# Patient Record
Sex: Male | Born: 1955 | Race: Black or African American | Hispanic: No | Marital: Single | Smoking: Current some day smoker
Health system: Southern US, Community
[De-identification: ages and names within clinical notes are randomized; demographics above are authoritative.]

---

## 2007-11-26 ENCOUNTER — Ambulatory Visit: Payer: Self-pay | Admitting: Internal Medicine

## 2007-11-26 LAB — CONVERTED CEMR LAB
Alkaline Phosphatase: 49 units/L (ref 39–117)
Eosinophils Absolute: 0.1 10*3/uL (ref 0.0–0.7)
Eosinophils Relative: 3 % (ref 0–5)
Glucose, Bld: 106 mg/dL — ABNORMAL HIGH (ref 70–99)
HCT: 38 % — ABNORMAL LOW (ref 39.0–52.0)
HCV Ab: NEGATIVE
HIV 1 RNA Quant: 52900 copies/mL — ABNORMAL HIGH (ref <50)
Hep B S Ab: NEGATIVE
Lymphs Abs: 1.3 10*3/uL (ref 0.7–4.0)
MCV: 85.8 fL (ref 78.0–100.0)
Platelets: 249 10*3/uL (ref 150–400)
RDW: 12.6 % (ref 11.5–15.5)
Sodium: 136 meq/L (ref 135–145)
Total Bilirubin: 0.4 mg/dL (ref 0.3–1.2)
Total Lymphocytes %: 34 % (ref 12–46)
Total Protein: 8 g/dL (ref 6.0–8.3)
WBC: 3.9 10*3/uL — ABNORMAL LOW (ref 4.0–10.5)

## 2007-11-30 ENCOUNTER — Ambulatory Visit: Payer: Self-pay | Admitting: *Deleted

## 2007-12-10 ENCOUNTER — Ambulatory Visit: Payer: Self-pay | Admitting: Internal Medicine

## 2007-12-28 ENCOUNTER — Ambulatory Visit (HOSPITAL_COMMUNITY): Admission: RE | Admit: 2007-12-28 | Discharge: 2007-12-28 | Payer: Self-pay | Admitting: Internal Medicine

## 2008-01-20 ENCOUNTER — Encounter: Payer: Self-pay | Admitting: Internal Medicine

## 2008-01-20 LAB — CONVERTED CEMR LAB
Albumin: 4 g/dL (ref 3.5–5.2)
Alkaline Phosphatase: 50 units/L (ref 39–117)
BUN: 13 mg/dL (ref 6–23)
CD4 T Helper %: 18 % — ABNORMAL LOW (ref 32–62)
Eosinophils Absolute: 0.2 10*3/uL (ref 0.0–0.7)
Eosinophils Relative: 6 % — ABNORMAL HIGH (ref 0–5)
Glucose, Bld: 81 mg/dL (ref 70–99)
HCT: 38.6 % — ABNORMAL LOW (ref 39.0–52.0)
HIV 1 RNA Quant: 761 copies/mL — ABNORMAL HIGH (ref <50)
HIV-1 RNA Quant, Log: 2.88 — ABNORMAL HIGH (ref <1.70)
Hemoglobin: 12.8 g/dL — ABNORMAL LOW (ref 13.0–17.0)
Lymphs Abs: 1.4 10*3/uL (ref 0.7–4.0)
MCV: 87.1 fL (ref 78.0–100.0)
Monocytes Absolute: 0.4 10*3/uL (ref 0.1–1.0)
Monocytes Relative: 10 % (ref 3–12)
RBC: 4.43 M/uL (ref 4.22–5.81)
Total Bilirubin: 0.3 mg/dL (ref 0.3–1.2)
Total lymphocyte count: 1400 cells/mcL (ref 700–3300)
WBC, lymph enumeration: 4 10*3/uL (ref 4.0–10.5)
WBC: 4 10*3/uL (ref 4.0–10.5)

## 2008-01-21 ENCOUNTER — Ambulatory Visit: Payer: Self-pay | Admitting: Internal Medicine

## 2008-02-04 ENCOUNTER — Ambulatory Visit: Payer: Self-pay | Admitting: Internal Medicine

## 2008-05-05 ENCOUNTER — Encounter: Payer: Self-pay | Admitting: Internal Medicine

## 2008-05-05 ENCOUNTER — Ambulatory Visit: Payer: Self-pay | Admitting: Internal Medicine

## 2008-05-05 LAB — CONVERTED CEMR LAB
ALT: 12 units/L (ref 0–53)
AST: 17 units/L (ref 0–37)
Absolute CD4: 345 #/uL — ABNORMAL LOW (ref 381–1469)
Alkaline Phosphatase: 67 units/L (ref 39–117)
BUN: 17 mg/dL (ref 6–23)
Basophils Absolute: 0 10*3/uL (ref 0.0–0.1)
Basophils Relative: 1 % (ref 0–1)
CD4 T Helper %: 34 % (ref 32–62)
Calcium: 8.6 mg/dL (ref 8.4–10.5)
Chloride: 105 meq/L (ref 96–112)
Creatinine, Ser: 1.11 mg/dL (ref 0.40–1.50)
Eosinophils Absolute: 0.1 10*3/uL (ref 0.0–0.7)
Hemoglobin: 12.5 g/dL — ABNORMAL LOW (ref 13.0–17.0)
MCHC: 33.3 g/dL (ref 30.0–36.0)
MCV: 87.6 fL (ref 78.0–100.0)
Monocytes Absolute: 0.4 10*3/uL (ref 0.1–1.0)
Neutro Abs: 2.3 10*3/uL (ref 1.7–7.7)
RDW: 13.9 % (ref 11.5–15.5)
Total Bilirubin: 0.3 mg/dL (ref 0.3–1.2)
Total Lymphocytes %: 26 % (ref 12–46)
Total lymphocyte count: 1014 cells/mcL (ref 700–3300)
WBC, lymph enumeration: 3.9 10*3/uL — ABNORMAL LOW (ref 4.0–10.5)

## 2008-05-14 IMAGING — CR DG HIP (WITH OR WITHOUT PELVIS) 2-3V*L*
3 series · 3 of 3 positions shown · non-contrast
Comparison: none

CLINICAL DATA: Pain. 
 LEFT HIP W/PELVIS ? 3 VIEW- 12/28/07:

[t pelvis a.p.]
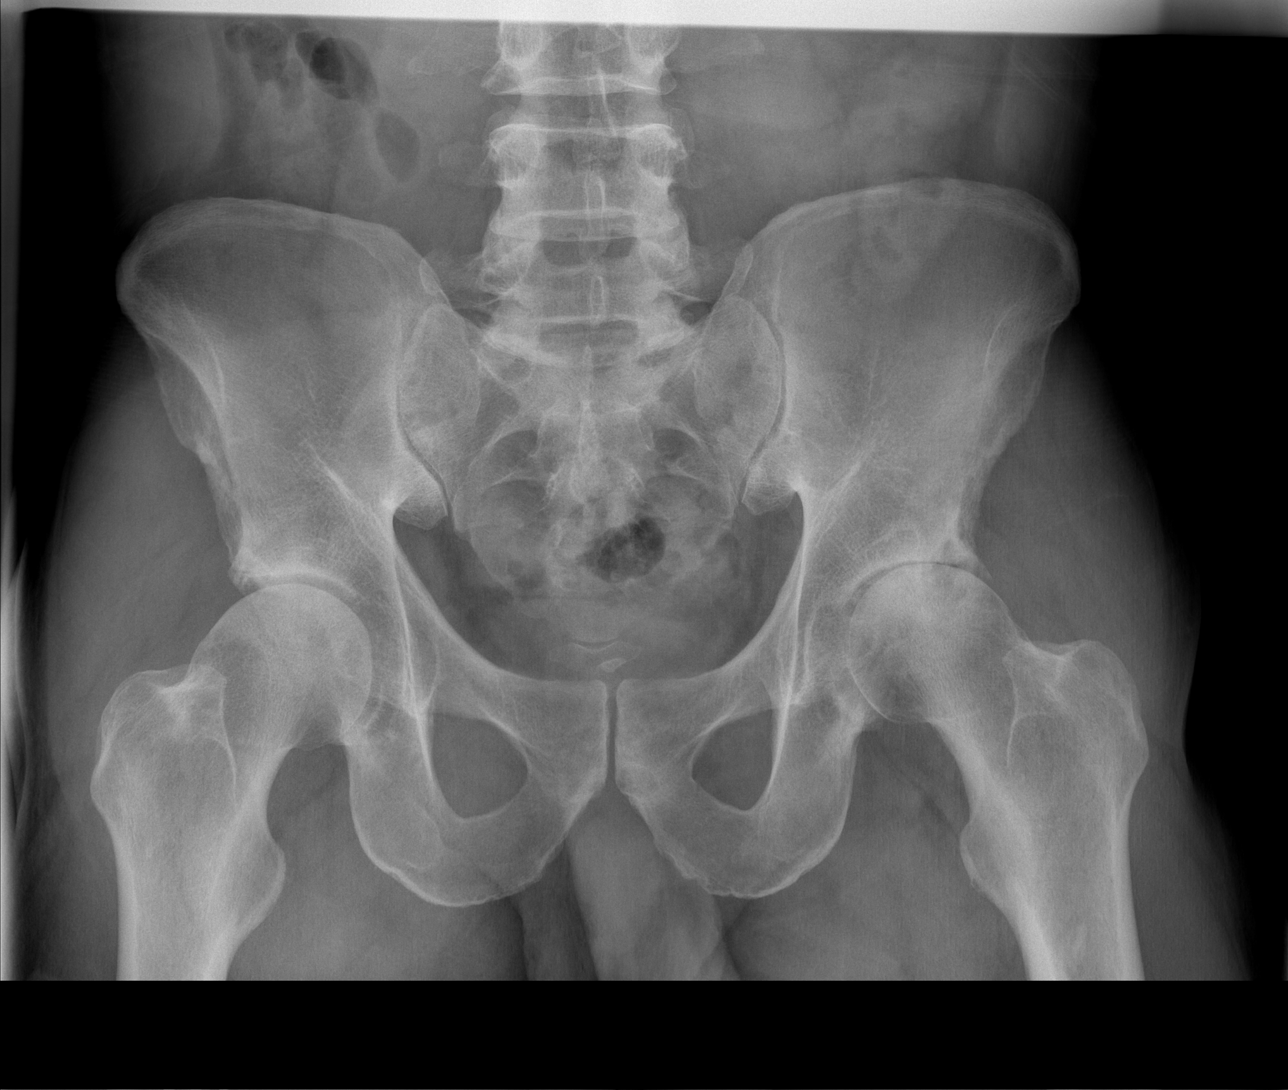

[t hip ap left]
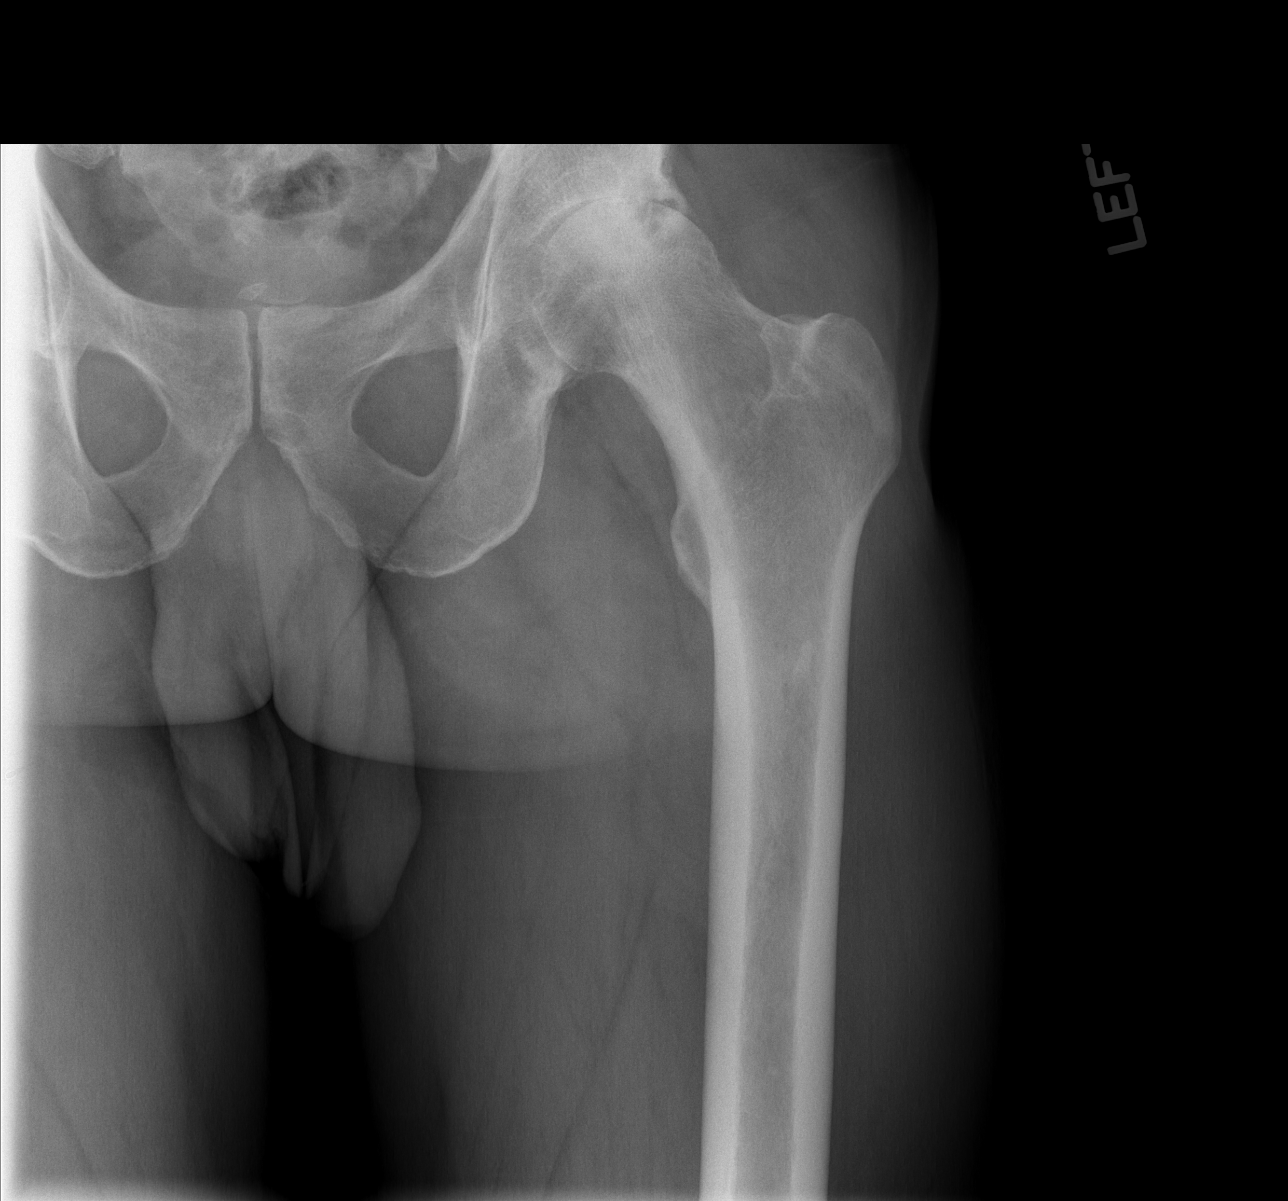

[t hip frog leg left]
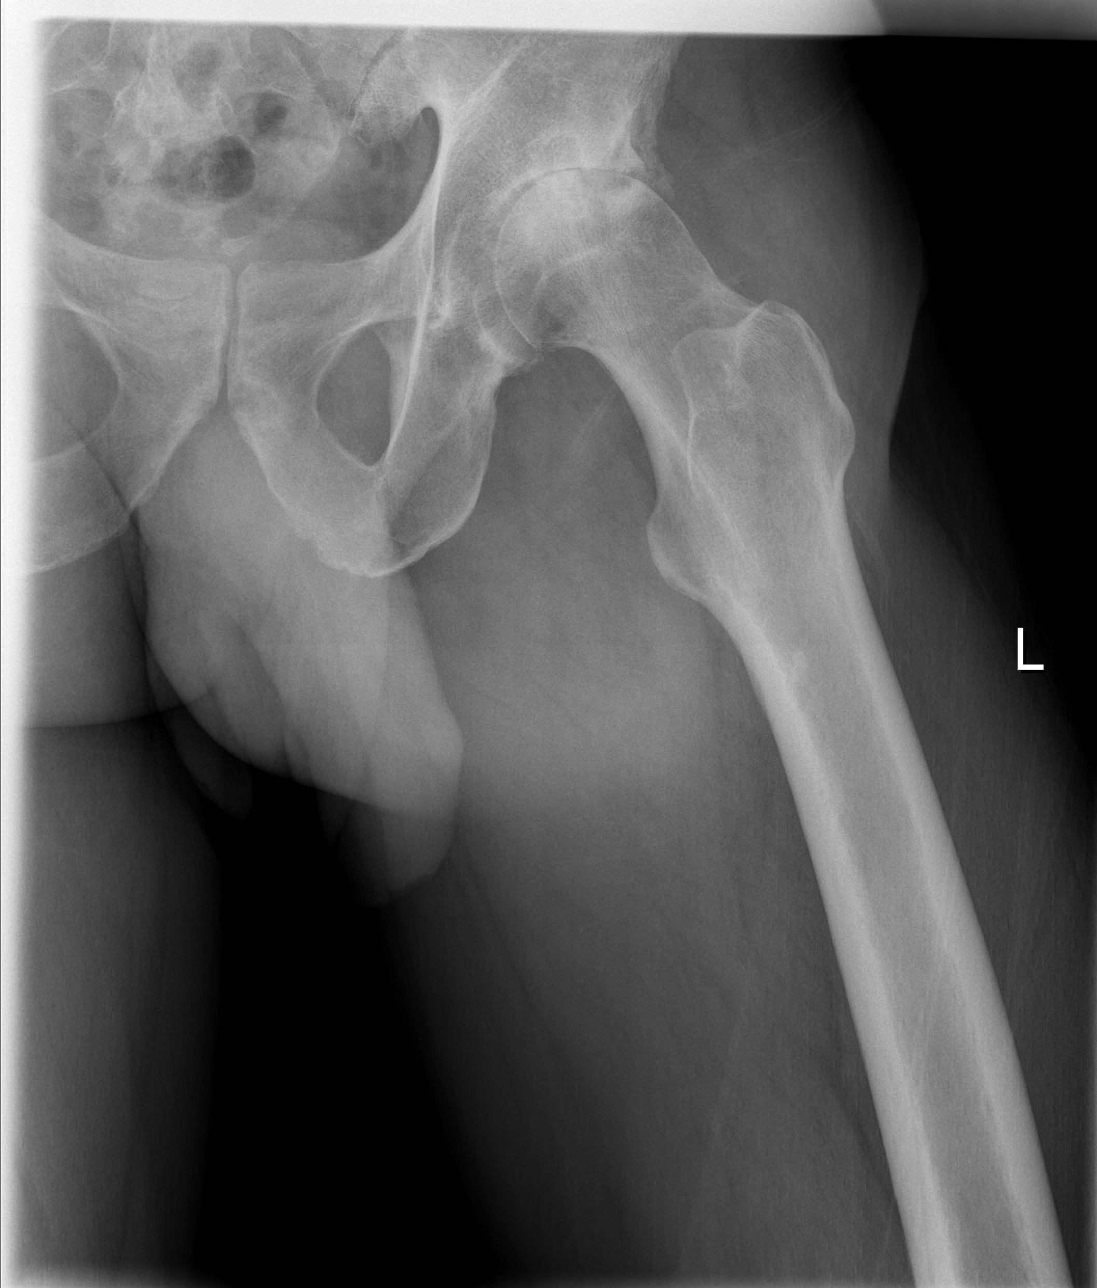

[3 of 3 positions shown; findings below may reference images not displayed]

FINDINGS: There is loss of the superior and lateral left hip joint space with bone on bone, juxtaarticular sclerosis, and juxtaarticular cystic change.  Similar but mild degenerative change of the right hip joint is present.  Mild degenerative changes in the SI joints.  No acute fractures or dislocations are seen.
IMPRESSION: Severe osteoarthritic change of the left hip joint.

## 2008-05-19 ENCOUNTER — Ambulatory Visit: Payer: Self-pay | Admitting: Internal Medicine

## 2008-05-20 ENCOUNTER — Ambulatory Visit (HOSPITAL_COMMUNITY): Admission: RE | Admit: 2008-05-20 | Discharge: 2008-05-20 | Payer: Self-pay | Admitting: Internal Medicine

## 2008-07-25 ENCOUNTER — Encounter: Payer: Self-pay | Admitting: Internal Medicine

## 2008-07-25 ENCOUNTER — Ambulatory Visit: Payer: Self-pay | Admitting: Internal Medicine

## 2008-07-25 LAB — CONVERTED CEMR LAB
ALT: 12 units/L (ref 0–53)
Albumin: 4.4 g/dL (ref 3.5–5.2)
Basophils Absolute: 0 10*3/uL (ref 0.0–0.1)
CD4 T Helper %: 36 % (ref 32–62)
CO2: 20 meq/L (ref 19–32)
Eosinophils Relative: 4 % (ref 0–5)
Glucose, Bld: 90 mg/dL (ref 70–99)
HIV 1 RNA Quant: 279 copies/mL — ABNORMAL HIGH (ref <50)
HIV-1 RNA Quant, Log: 2.45 — ABNORMAL HIGH (ref <1.70)
Lymphocytes Relative: 21 % (ref 12–46)
Lymphs Abs: 1.1 10*3/uL (ref 0.7–4.0)
Neutro Abs: 3.4 10*3/uL (ref 1.7–7.7)
Neutrophils Relative %: 65 % (ref 43–77)
Platelets: 255 10*3/uL (ref 150–400)
Potassium: 3.7 meq/L (ref 3.5–5.3)
RDW: 13.5 % (ref 11.5–15.5)
Sodium: 137 meq/L (ref 135–145)
Total Bilirubin: 0.6 mg/dL (ref 0.3–1.2)
Total Protein: 7.8 g/dL (ref 6.0–8.3)
Total lymphocyte count: 1071 cells/mcL (ref 700–3300)
WBC: 5.1 10*3/uL (ref 4.0–10.5)

## 2008-08-08 ENCOUNTER — Ambulatory Visit: Payer: Self-pay | Admitting: Internal Medicine

## 2008-12-05 DIAGNOSIS — I10 Essential (primary) hypertension: Secondary | ICD-10-CM | POA: Insufficient documentation

## 2008-12-05 DIAGNOSIS — B20 Human immunodeficiency virus [HIV] disease: Secondary | ICD-10-CM

## 2008-12-05 DIAGNOSIS — M25559 Pain in unspecified hip: Secondary | ICD-10-CM

## 2008-12-05 DIAGNOSIS — M1990 Unspecified osteoarthritis, unspecified site: Secondary | ICD-10-CM | POA: Insufficient documentation

## 2008-12-05 DIAGNOSIS — F339 Major depressive disorder, recurrent, unspecified: Secondary | ICD-10-CM

## 2009-01-16 ENCOUNTER — Ambulatory Visit: Payer: Self-pay | Admitting: Internal Medicine

## 2009-01-16 ENCOUNTER — Encounter: Payer: Self-pay | Admitting: Internal Medicine

## 2009-01-16 LAB — CONVERTED CEMR LAB
ALT: 25 units/L (ref 0–53)
Absolute CD4: 449 #/uL (ref 381–1469)
Albumin: 4 g/dL (ref 3.5–5.2)
CO2: 19 meq/L (ref 19–32)
Calcium: 8.8 mg/dL (ref 8.4–10.5)
Chloride: 105 meq/L (ref 96–112)
Glucose, Bld: 95 mg/dL (ref 70–99)
Hemoglobin: 12.3 g/dL — ABNORMAL LOW (ref 13.0–17.0)
Lymphs Abs: 1.8 10*3/uL (ref 0.7–4.0)
Monocytes Absolute: 0.4 10*3/uL (ref 0.1–1.0)
Monocytes Relative: 9 % (ref 3–12)
Neutro Abs: 2.2 10*3/uL (ref 1.7–7.7)
Neutrophils Relative %: 48 % (ref 43–77)
Potassium: 4 meq/L (ref 3.5–5.3)
RBC: 4.34 M/uL (ref 4.22–5.81)
Sodium: 137 meq/L (ref 135–145)
Total Lymphocytes %: 39 % (ref 12–46)
Total Protein: 7.7 g/dL (ref 6.0–8.3)
WBC: 4.6 10*3/uL (ref 4.0–10.5)

## 2009-01-30 ENCOUNTER — Ambulatory Visit: Payer: Self-pay | Admitting: Internal Medicine

## 2009-01-30 ENCOUNTER — Encounter: Payer: Self-pay | Admitting: Internal Medicine

## 2009-01-30 DIAGNOSIS — J45909 Unspecified asthma, uncomplicated: Secondary | ICD-10-CM | POA: Insufficient documentation

## 2009-03-13 ENCOUNTER — Ambulatory Visit: Payer: Self-pay | Admitting: Internal Medicine

## 2009-03-13 LAB — CONVERTED CEMR LAB
ALT: 13 units/L (ref 0–53)
AST: 15 units/L (ref 0–37)
Absolute CD4: 457 #/uL (ref 381–1469)
Albumin: 4.3 g/dL (ref 3.5–5.2)
BUN: 17 mg/dL (ref 6–23)
Calcium: 9.5 mg/dL (ref 8.4–10.5)
Chloride: 104 meq/L (ref 96–112)
Eosinophils Relative: 8 % — ABNORMAL HIGH (ref 0–5)
HCT: 41.5 % (ref 39.0–52.0)
Hemoglobin: 13.8 g/dL (ref 13.0–17.0)
Lymphocytes Relative: 34 % (ref 12–46)
Lymphs Abs: 1.6 10*3/uL (ref 0.7–4.0)
Monocytes Relative: 10 % (ref 3–12)
Platelets: 279 10*3/uL (ref 150–400)
Potassium: 4.4 meq/L (ref 3.5–5.3)
RBC: 4.88 M/uL (ref 4.22–5.81)
WBC: 4.8 10*3/uL (ref 4.0–10.5)

## 2009-03-27 ENCOUNTER — Encounter: Payer: Self-pay | Admitting: Internal Medicine

## 2009-03-27 ENCOUNTER — Ambulatory Visit: Payer: Self-pay | Admitting: Internal Medicine

## 2009-04-03 ENCOUNTER — Telehealth: Payer: Self-pay | Admitting: Internal Medicine

## 2009-06-29 ENCOUNTER — Ambulatory Visit: Payer: Self-pay | Admitting: Internal Medicine

## 2009-06-29 LAB — CONVERTED CEMR LAB
ALT: 12 U/L
AST: 13 U/L
Absolute CD4: 502 {#}/uL
Albumin: 4.2 g/dL
Alkaline Phosphatase: 65 U/L
BUN: 14 mg/dL
Basophils Absolute: 0 K/uL
Basophils Relative: 0 %
CD4 T Helper %: 36 %
CO2: 20 meq/L
Calcium: 9 mg/dL
Chloride: 108 meq/L
Creatinine, Ser: 1.06 mg/dL
Eosinophils Absolute: 0.2 K/uL
Eosinophils Relative: 5 %
Glucose, Bld: 89 mg/dL
HCT: 40.4 %
HIV 1 RNA Quant: 70 {copies}/mL — ABNORMAL HIGH
HIV-1 RNA Quant, Log: 1.85 — ABNORMAL HIGH
Hemoglobin: 13.3 g/dL
Lymphocytes Relative: 31 %
Lymphs Abs: 1.4 K/uL
MCHC: 32.9 g/dL
MCV: 89.4 fL
Monocytes Absolute: 0.4 K/uL
Monocytes Relative: 10 %
Neutro Abs: 2.4 K/uL
Neutrophils Relative %: 54 %
Platelets: 241 K/uL
Potassium: 4 meq/L
RBC: 4.52 M/uL
RDW: 13.4 %
Sodium: 138 meq/L
Total Bilirubin: 0.3 mg/dL
Total Protein: 7.7 g/dL
Total lymphocyte count: 1395 {cells}/uL
WBC: 4.5 10*3/microliter

## 2009-07-10 ENCOUNTER — Encounter: Payer: Self-pay | Admitting: Internal Medicine

## 2009-07-10 ENCOUNTER — Ambulatory Visit: Payer: Self-pay | Admitting: Internal Medicine

## 2009-08-03 ENCOUNTER — Telehealth: Payer: Self-pay | Admitting: Internal Medicine

## 2009-10-02 ENCOUNTER — Encounter: Payer: Self-pay | Admitting: Internal Medicine

## 2009-10-02 ENCOUNTER — Ambulatory Visit: Payer: Self-pay | Admitting: Internal Medicine

## 2009-10-02 LAB — CONVERTED CEMR LAB
AST: 14 units/L (ref 0–37)
Albumin: 4.6 g/dL (ref 3.5–5.2)
Alkaline Phosphatase: 50 units/L (ref 39–117)
Basophils Absolute: 0 10*3/uL (ref 0.0–0.1)
CD4 T Helper %: 35 % (ref 32–62)
Calcium: 9.2 mg/dL (ref 8.4–10.5)
Chloride: 108 meq/L (ref 96–112)
HCT: 44.8 % (ref 39.0–52.0)
HIV 1 RNA Quant: 128 copies/mL — ABNORMAL HIGH (ref <48)
HIV-1 RNA Quant, Log: 2.11 — ABNORMAL HIGH (ref <1.68)
Hemoglobin: 14.6 g/dL (ref 13.0–17.0)
LDL Cholesterol: 156 mg/dL — ABNORMAL HIGH (ref 0–99)
Lymphocytes Relative: 23 % (ref 12–46)
Lymphs Abs: 0.9 10*3/uL (ref 0.7–4.0)
Monocytes Absolute: 0.4 10*3/uL (ref 0.1–1.0)
Neutro Abs: 2.5 10*3/uL (ref 1.7–7.7)
Platelets: 264 10*3/uL (ref 150–400)
Potassium: 4.6 meq/L (ref 3.5–5.3)
RDW: 13.6 % (ref 11.5–15.5)
Sodium: 138 meq/L (ref 135–145)
Total Protein: 7.9 g/dL (ref 6.0–8.3)
Total lymphocyte count: 897 cells/mcL (ref 700–3300)
WBC: 3.9 10*3/uL — ABNORMAL LOW (ref 4.0–10.5)

## 2009-11-30 ENCOUNTER — Encounter: Payer: Self-pay | Admitting: Internal Medicine

## 2009-11-30 ENCOUNTER — Ambulatory Visit: Payer: Self-pay | Admitting: Internal Medicine

## 2009-11-30 DIAGNOSIS — E785 Hyperlipidemia, unspecified: Secondary | ICD-10-CM | POA: Insufficient documentation

## 2010-02-08 ENCOUNTER — Encounter: Payer: Self-pay | Admitting: Internal Medicine

## 2010-02-20 ENCOUNTER — Telehealth: Payer: Self-pay | Admitting: Internal Medicine

## 2010-09-05 ENCOUNTER — Telehealth: Payer: Self-pay | Admitting: Internal Medicine

## 2010-09-05 ENCOUNTER — Ambulatory Visit: Payer: Self-pay | Admitting: Internal Medicine

## 2010-09-05 LAB — CONVERTED CEMR LAB
AST: 47 units/L — ABNORMAL HIGH (ref 0–37)
Alkaline Phosphatase: 71 units/L (ref 39–117)
BUN: 19 mg/dL (ref 6–23)
Basophils Absolute: 0 10*3/uL (ref 0.0–0.1)
Calcium: 9.2 mg/dL (ref 8.4–10.5)
Chloride: 107 meq/L (ref 96–112)
Creatinine, Ser: 1.2 mg/dL (ref 0.40–1.50)
Eosinophils Relative: 4 % (ref 0–5)
HCT: 40.2 % (ref 39.0–52.0)
Hemoglobin: 13.4 g/dL (ref 13.0–17.0)
Lymphocytes Relative: 33 % (ref 12–46)
MCV: 88.4 fL (ref 78.0–100.0)
Monocytes Absolute: 0.5 10*3/uL (ref 0.1–1.0)
RDW: 13.3 % (ref 11.5–15.5)
Total Bilirubin: 0.3 mg/dL (ref 0.3–1.2)

## 2010-09-12 ENCOUNTER — Encounter: Payer: Self-pay | Admitting: Internal Medicine

## 2010-09-20 ENCOUNTER — Ambulatory Visit: Payer: Self-pay | Admitting: Internal Medicine

## 2010-09-27 ENCOUNTER — Ambulatory Visit: Payer: Self-pay | Admitting: Internal Medicine

## 2010-09-27 ENCOUNTER — Encounter (INDEPENDENT_AMBULATORY_CARE_PROVIDER_SITE_OTHER): Payer: Self-pay | Admitting: *Deleted

## 2010-09-28 ENCOUNTER — Encounter (INDEPENDENT_AMBULATORY_CARE_PROVIDER_SITE_OTHER): Payer: Self-pay | Admitting: *Deleted

## 2010-10-26 ENCOUNTER — Encounter: Payer: Self-pay | Admitting: Internal Medicine

## 2010-11-08 ENCOUNTER — Ambulatory Visit: Admit: 2010-11-08 | Payer: Self-pay | Admitting: Internal Medicine

## 2010-11-20 NOTE — Progress Notes (Signed)
Summary: refill meds  Phone Note Call from Patient   Caller: Patient Summary of Call: pt. came in for lab work and is requesting a refill on Ibuprofen and B/P med.  Pt. takes Norvasc but it is not on the $4.00 list at [facility] and pt. use to be able to get meds at [facility].  Can you switch to a $4.00 B/P med.  Uses [facility] [address] [place] Initial call taken by: Wendall Mola CMA Duncan Dull),  September 05, 2010 3:10 PM  Follow-up for Phone Call        change to diltiazem 120mg  q d Follow-up by: Yisroel Ramming MD,  September 05, 2010 3:36 PM

## 2010-11-20 NOTE — Miscellaneous (Signed)
Summary: HIPAA Restrictions  HIPAA Restrictions   Imported By: Florinda Marker 09/05/2010 16:13:29  _____________________________________________________________________  External Attachment:    Type:   Image     Comment:   External Document

## 2010-11-20 NOTE — Miscellaneous (Signed)
Summary: RW Financial Update   Clinical Lists Changes  Observations: Added new observation of AIDSDAP: No-applying (09/27/2010 17:05) Added new observation of PCTFPL: 0  (09/27/2010 17:05) Added new observation of INCOMESOURCE: none  (09/27/2010 17:05) Added new observation of HOUSEINCOME: 0  (09/27/2010 17:05) Added new observation of #CHILD<18 IN: No  (09/27/2010 17:05) Added new observation of FAMILYSIZE: 1  (09/27/2010 17:05) Added new observation of HOUSING: Stable/permanent  (09/27/2010 17:05) Added new observation of FINASSESSDT: 09/27/2010  (09/27/2010 17:05) Added new observation of YEARLYEXPEN: 0  (09/27/2010 17:05)  Appended Document: RW Financial Update  Received call from ADAP - Patient has Medicaid [place] - ID # [id number] T - does not need ADAP

## 2010-11-20 NOTE — Miscellaneous (Signed)
  Clinical Lists Changes  Orders: Added new Test order of T-HIV Genotype [phone]) - Signed     Process Orders Check Orders Results:     [facility]: ABN not required for this insurance Tests Sent for requisitioning (September 12, 2010 10:53 AM):     09/12/2010: [facility] -- T-HIV Genotype [phone] (signed)

## 2010-11-20 NOTE — Assessment & Plan Note (Signed)
Summary: start new regimen/jc   CC:  f/u change in meds.  History of Present Illness: patient here to get the results of his genotype.  His genotype showed that he had resistance to do a triple that which he is currently taking.  We discussed treatment options.  He wanted a once a day regimen.  He has yet to pick up his new blood pressure medication.  He states he will do that today as he has money.   Current Allergies (reviewed today): No known allergies  Review of Systems  The patient denies anorexia, fever, and weight loss.    Vital Signs:  Patient profile:   55 year old male Height:      71 inches (180.34 cm) Weight:      189 pounds (85.91 kg) BMI:     26.46 Temp:     97.5 degrees F (36.39 degrees C) oral Pulse rate:   91 / minute BP sitting:   170 / 98  (left arm)  Vitals Entered By: Starleen Arms CMA (September 27, 2010 2:56 PM) CC: f/u change in meds Is Patient Diabetic? No Pain Assessment Patient in pain? yes     Location: hip Intensity: 5 Type: aching Nutritional Status BMI of 25 - 29 = overweight  Does patient need assistance? Functional Status Self care Ambulation Normal   Physical Exam  General:  alert, well-developed, well-nourished, and well-hydrated.   Head:  normocephalic and atraumatic.   Mouth:  pharynx pink and moist.   Lungs:  normal breath sounds.      Impression & Recommendations:  Problem # 1:  HIV DISEASE (ICD-042) Genotype shows resistance toAtripla will switch to Truvada, Prezista and Norvir.  Potential side effects discussed.  Repeat labsin 6 weeks. Diagnostics Reviewed:  CD4: 350 (09/06/2010)   WBC: 3.9 (09/05/2010)   Hgb: 13.4 (09/05/2010)   HCT: 40.2 (09/05/2010)   Platelets: 237 (09/05/2010) HIV genotype: See Comment (09/12/2010)   HIV-1 RNA: 51600 (09/05/2010)   HBSAg: NEG (11/26/2007)  Problem # 2:  HYPERTENSION (ICD-401.9) Pt encouraged to fill his Rx and take his BP medication. His updated medication list for this  problem includes:    Diltiazem Hcl 120 Mg Tabs (Diltiazem hcl) .Marland Kitchen... Take 1 tablet by mouth once a day  Medications Added to Medication List This Visit: 1)  Truvada 200-300 Mg Tabs (Emtricitabine-tenofovir) .... Take 1 tablet by mouth once a day 2)  Prezista 400 Mg Tabs (Darunavir ethanolate) .... Take 2 tablets once a day 3)  Norvir 100 Mg Tabs (Ritonavir) .... Take 1 tablet by mouth once a day  Other Orders: Est. Patient Level III (04540) Future Orders: T-CD4SP ([facility]) (CD4SP) ... 11/08/2010 T-HIV Viral Load [phone]) ... 11/08/2010 T-Comprehensive Metabolic Panel [phone]) ... 11/08/2010 T-CBC w/Diff ([id number]) ... 11/08/2010 T-RPR (Syphilis) [phone]) ... 11/08/2010  Patient Instructions: 1)  Please schedule a follow-up appointment in 8 weeks, 2 weeks after labs.  Prescriptions: NORVIR 100 MG TABS (RITONAVIR) Take 1 tablet by mouth once a day  #30 x 5   Entered and Authorized by:   Yisroel Ramming MD   Signed by:   Yisroel Ramming MD on 09/27/2010   Method used:   Print then Give to Patient   RxID:   [id number] PREZISTA 400 MG TABS (DARUNAVIR ETHANOLATE) take 2 tablets once a day  #60 x 5   Entered and Authorized by:   Yisroel Ramming MD   Signed by:   Yisroel Ramming MD on 09/27/2010   Method used:  Print then Give to Patient   RxID:   [id number] TRUVADA 200-300 MG TABS (EMTRICITABINE-TENOFOVIR) Take 1 tablet by mouth once a day  #30 x 5   Entered and Authorized by:   Yisroel Ramming MD   Signed by:   Yisroel Ramming MD on 09/27/2010   Method used:   Print then Give to Patient   RxID:   [id number]

## 2010-11-20 NOTE — Consult Note (Signed)
Summary: [facility] [place]: Comprehensive Clinical  Psychological  [facility] [place]: Comprehensive Clinical  Psychological   Imported By: Florinda Marker 05/02/2010 10:10:06  _____________________________________________________________________  External Attachment:    Type:   Image     Comment:   External Document

## 2010-11-20 NOTE — Assessment & Plan Note (Signed)
Summary: CHECKUP [MKJ]   CC:  follow-up visit, lab results, refill Lipitor, and stopped Atripla 2 months ago due to nightmares.  History of Present Illness: patient here for follow-up on labs.  He is not taking to a trip for several months now.  He states that his mother died approximately 1 year ago and he became depressed and started drinking and stopped taking his medication.  He wants to get back on his medication.  He did not pick up the diltiazem that we called in to [facility].  He states he will pick that up today.  He denies chest pain or headaches.  Preventive Screening-Counseling & Management  Alcohol-Tobacco     Alcohol drinks/day: occasional     Smoking Status: quit  Caffeine-Diet-Exercise     Caffeine use/day: occasional     Does Patient Exercise: yes     Type of exercise: walks     Exercise (avg: min/session): >60     Times/week: 5  Safety-Violence-Falls     Seat Belt Use: yes      Drug Use:  never.    Comments: pt. given condoms   Updated Prior Medication List: ULTRAM 50 MG TABS (TRAMADOL HCL) Take 1 tablet by mouth every 8 hours as needed ATRIPLA 600-200-300 MG TABS (EFAVIRENZ-EMTRICITAB-TENOFOVIR) Take 1 tablet by mouth at bedtime PROAIR HFA 108 (90 BASE) MCG/ACT AERS (ALBUTEROL SULFATE) one puff every 6 hours as needed ADVAIR DISKUS 250-50 MCG/DOSE MISC (FLUTICASONE-SALMETEROL) one puff two times a day IBUPROFEN 800 MG TABS (IBUPROFEN) Take 1 tablet by mouth every 8 hours as needed LIPITOR 20 MG TABS (ATORVASTATIN CALCIUM) Take 1 tablet by mouth once a day DILTIAZEM HCL 120 MG TABS (DILTIAZEM HCL) Take 1 tablet by mouth once a day  Current Allergies (reviewed today): No known allergies  Past History:  Past Medical History: Last updated: 01/30/2009 HIV disease Hypertension Osteoarthritis Depression Asthma  Social History: Drug Use:  never  Review of Systems  The patient denies anorexia, fever, and weight loss.    Vital Signs:  Patient  profile:   55 year old male Height:      71 inches (180.34 cm) Weight:      187.0 pounds (85 kg) BMI:     26.18 Temp:     98.0 degrees F (36.67 degrees C) oral Pulse rate:   90 / minute BP sitting:   177 / 96  (right arm)  Vitals Entered By: Wendall Mola CMA Duncan Dull) (September 20, 2010 3:35 PM) CC: follow-up visit, lab results, refill Lipitor, stopped Atripla 2 months ago due to nightmares Is Patient Diabetic? No Pain Assessment Patient in pain? yes     Location: left hip Intensity: 5 Type: aching Onset of pain  Constant Nutritional Status BMI of 25 - 29 = overweight Nutritional Status Detail appetite "normal"  Have you ever been in a relationship where you felt threatened, hurt or afraid?No   Does patient need assistance? Functional Status Self care Ambulation Normal   Physical Exam  General:  alert, well-developed, well-nourished, and well-hydrated.   Head:  normocephalic and atraumatic.   Mouth:  pharynx pink and moist.   Lungs:  normal breath sounds.     Impression & Recommendations:  Problem # 1:  HIV DISEASE (ICD-042) Pt will re-start meds and try to take them every day. He will f/u in 6 weeks for repeat labs. Diagnostics Reviewed:  CD4: 350 (09/06/2010)   WBC: 3.9 (09/05/2010)   Hgb: 13.4 (09/05/2010)   HCT: 40.2 (09/05/2010)  Platelets: 237 (09/05/2010) HIV genotype: REPORT (11/26/2007)   HIV-1 RNA: 51600 (09/05/2010)   HBSAg: NEG (11/26/2007)  Problem # 2:  HYPERTENSION (ICD-401.9) Pt encouraged to pick up his diltiazem and take it every day. His updated medication list for this problem includes:    Diltiazem Hcl 120 Mg Tabs (Diltiazem hcl) .Marland Kitchen... Take 1 tablet by mouth once a day  Other Orders: Influenza Vaccine NON MCR (41324) TB Skin Test (40102) Admin 1st Vaccine (72536) Est. Patient Level III (64403) Future Orders: T-CD4SP ([facility]) (CD4SP) ... 11/01/2010 T-HIV Viral Load [phone]) ... 11/01/2010 T-Comprehensive Metabolic Panel  ([id number]) ... 11/01/2010 T-CBC w/Diff ([id number]) ... 11/01/2010 T-RPR (Syphilis) [phone]) ... 11/01/2010  Patient Instructions: 1)  Please schedule a follow-up appointment in 3 months, 2 weeks after labs.      Immunizations Administered:  Influenza Vaccine # 1:    Vaccine Type: Fluvax Non-MCR    Site: right deltoid    Mfr: Novartis    Dose: 0.5 ml    Route: IM    Given by: Wendall Mola CMA ( AAMA)    Exp. Date: 01/20/2011    Lot #: [id number]    VIS given: 05/15/10 version given September 20, 2010.  PPD Skin Test:    Vaccine Type: PPD    Site: left forearm    Mfr: Sanofi Pasteur    Dose: 0.1 ml    Route: ID    Given by: Wendall Mola CMA ( AAMA)    Exp. Date: 08/23/2011    Lot #: [id number]  Flu Vaccine Consent Questions:    Do you have a history of severe allergic reactions to this vaccine? no    Any prior history of allergic reactions to egg and/or gelatin? no    Do you have a sensitivity to the preservative Thimersol? no    Do you have a past history of Guillan-Barre Syndrome? no    Do you currently have an acute febrile illness? no    Have you ever had a severe reaction to latex? no    Vaccine information given and explained to patient? yes

## 2010-11-20 NOTE — Miscellaneous (Signed)
  Clinical Lists Changes  Orders: Added new Test order of T-CBC w/Diff [id number]) - Signed Added new Test order of T-CD4SP [facility]) (CD4SP) - Signed Added new Test order of T-Comprehensive Metabolic Panel [phone]) - Signed Added new Test order of T-HIV Viral Load [id number]) - Signed     Process Orders Check Orders Results:     [facility]: ABN not required for this insurance Tests Sent for requisitioning (September 05, 2010 3:18 PM):     09/05/2010: [facility] -- T-CBC w/Diff [[id number]] (signed)     09/05/2010: [facility] -- T-Comprehensive Metabolic Panel [[id number]] (signed)     09/05/2010: [facility] -- T-HIV Viral Load [id number] (signed)

## 2010-11-20 NOTE — Miscellaneous (Signed)
Summary: Office Visit ([facility] 05)    Vital Signs:  Patient profile:   55 year old male Weight:      190 pounds Temp:     97.9 degrees F oral Pulse rate:   74 / minute Resp:     18 per minute BP sitting:   170 / 113  (left arm)  Vitals Entered By: Sharen Heck RN (November 30, 2009 9:42 AM) CC: f/u 05,not takingBP meds  Is Patient Diabetic? No Pain Assessment Patient in pain? yes     Location: left hip Intensity: 7 Type: aching Onset of pain  Chronic  Does patient need assistance? Functional Status Self care Ambulation Normal Comments BPBP   CC:  f/u 05 and not takingBP meds .  History of Present Illness: Pt has been out of his BP medications.  He is having trouble getting to our pharmacy. He did have a HA this morning.  He continues to have left hip pain. No missed doses of his Atripla.  Preventive Screening-Counseling & Management  Alcohol-Tobacco     Alcohol drinks/day: 0     Smoking Status: quit  Caffeine-Diet-Exercise     Caffeine use/day: 0     Does Patient Exercise: yes     Type of exercise: walks     Exercise (avg: min/session): >60     Times/week: 5  Current Problems (verified): 1)  Asthma  (ICD-493.90) 2)  Hip Pain, Left, Chronic  (ICD-719.45) 3)  Depression  (ICD-311) 4)  Osteoarthritis  (ICD-715.90) 5)  Hypertension  (ICD-401.9) 6)  HIV Disease  (ICD-042)  Current Medications (verified): 1)  Ultram 50 Mg Tabs (Tramadol Hcl) .... Take 1 Tablet By Mouth Every 8 Hours As Needed 2)  Atripla 600-200-300 Mg Tabs (Efavirenz-Emtricitab-Tenofovir) .... Take 1 Tablet By Mouth At Bedtime 3)  Proair Hfa 108 (90 Base) Mcg/act Aers (Albuterol Sulfate) .... One Puff Every 6 Hours As Needed 4)  Advair Diskus 250-50 Mcg/dose Misc (Fluticasone-Salmeterol) .... One Puff Two Times A Day 5)  Ibuprofen 800 Mg Tabs (Ibuprofen) .... Take 1 Tablet By Mouth Every 8 Hours As Needed 6)  Norvasc 10 Mg Tabs (Amlodipine Besylate) .... Take 1 Tablet By Mouth Once A  Day 7)  Lipitor 20 Mg Tabs (Atorvastatin Calcium) .... Take 1 Tablet By Mouth Once A Day  Allergies (verified): No Known Drug Allergies   Review of Systems  The patient denies anorexia, fever, weight loss, and chest pain.     Physical Exam  General:  alert, well-developed, well-nourished, and well-hydrated.   Head:  normocephalic and atraumatic.   Mouth:  pharynx pink and moist.   Lungs:  normal breath sounds.      Impression & Recommendations:  Problem # 1:  HIV DISEASE (ICD-042) Will obtain labs in 4 weeks.  Pt to continue his Atripla and try not to miss any doses. Diagnostics Reviewed:  WBC: 3.9 (10/02/2009)   Hgb: 14.6 (10/02/2009)   HCT: 44.8 (10/02/2009)   Platelets: 264 (10/02/2009) HIV genotype: REPORT (11/26/2007)   HIV-1 RNA: 128 (10/02/2009)   HBSAg: NEG (11/26/2007)  Future Orders: T-CBC w/Diff ([id number]) ... 01/01/2010 T-CD4SP ([facility]) (CD4SP) ... 01/01/2010 T-Comprehensive Metabolic Panel [id number]) ... 01/01/2010 T-HIV Viral Load [id number]) ... 01/01/2010 T-Lipid Profile [id number]) ... 01/01/2010  Problem # 2:  HYPERTENSION (ICD-401.9) Assessment: Deteriorated Pt instructed to get back on norvasc. The following medications were removed from the medication list:    Vasotec 20 Mg Tabs (Enalapril maleate) .Marland Kitchen... Take 1 tablet by mouth once a day His  updated medication list for this problem includes:    Norvasc 10 Mg Tabs (Amlodipine besylate) .Marland Kitchen... Take 1 tablet by mouth once a day  Problem # 3:  HYPERLIPIDEMIA (ICD-272.4) discussed diet and exercise will start lipitor His updated medication list for this problem includes:    Lipitor 20 Mg Tabs (Atorvastatin calcium) .Marland Kitchen... Take 1 tablet by mouth once a day  Medications Added to Medication List This Visit: 1)  Lipitor 20 Mg Tabs (Atorvastatin calcium) .... Take 1 tablet by mouth once a day  Other Orders: Est. Patient Level III (16109)    Patient Instructions: 1)  Please schedule a  follow-up appointment in 6 weeks, 2 weeks after labs. Prescriptions: LIPITOR 20 MG TABS (ATORVASTATIN CALCIUM) Take 1 tablet by mouth once a day  #30 x 5   Entered and Authorized by:   Yisroel Ramming MD   Signed by:   Yisroel Ramming MD on 11/30/2009   Method used:   Print then Give to Patient   RxID:   [id number] NORVASC 10 MG TABS (AMLODIPINE BESYLATE) Take 1 tablet by mouth once a day  #30 x 5   Entered and Authorized by:   Yisroel Ramming MD   Signed by:   Yisroel Ramming MD on 11/30/2009   Method used:   Print then Give to Patient   RxID:   [id number]

## 2010-11-20 NOTE — Progress Notes (Signed)
Summary: Request to restart medication  Phone Note From Pharmacy   Caller: [facility] Reason for Call: Needs renewal Summary of Call: Received a fax refill request for Lisinopril #30 Once daily.  Last filled on 3/292010.  This drug is not in the EMR.  Do you want to restart this medication? it was removed in February 2011 from his medication list. Initial call taken by: Paulo Fruit  BS,CPht II,MPH,  Feb 20, 2010 3:39 PM  Follow-up for Phone Call        no changed to norvas Follow-up by: Yisroel Ramming MD,  Feb 20, 2010 4:15 PM  Additional Follow-up for Phone Call Additional follow up Details #1::        [facility] notified that Lisinopril should have been discontinued.  He is to be taking Norvasc 10mg  once daily. Additional Follow-up by: Paulo Fruit  BS,CPht II,MPH,  Feb 20, 2010 4:28 PM

## 2010-11-21 ENCOUNTER — Encounter: Payer: Self-pay | Admitting: Adult Health

## 2010-11-21 ENCOUNTER — Other Ambulatory Visit (INDEPENDENT_AMBULATORY_CARE_PROVIDER_SITE_OTHER): Payer: Medicaid Other

## 2010-11-21 ENCOUNTER — Other Ambulatory Visit: Payer: Self-pay | Admitting: Internal Medicine

## 2010-11-21 DIAGNOSIS — B20 Human immunodeficiency virus [HIV] disease: Secondary | ICD-10-CM

## 2010-11-21 LAB — CONVERTED CEMR LAB
ALT: 18 units/L (ref 0–53)
AST: 23 units/L (ref 0–37)
Albumin: 4.3 g/dL (ref 3.5–5.2)
BUN: 23 mg/dL (ref 6–23)
Basophils Absolute: 0 10*3/uL (ref 0.0–0.1)
Calcium: 9.1 mg/dL (ref 8.4–10.5)
Chloride: 105 meq/L (ref 96–112)
HIV-1 RNA Quant, Log: 1.72 — ABNORMAL HIGH (ref <1.30)
Lymphocytes Relative: 24 % (ref 12–46)
Neutro Abs: 2.7 10*3/uL (ref 1.7–7.7)
Neutrophils Relative %: 63 % (ref 43–77)
Platelets: 240 10*3/uL (ref 150–400)
Potassium: 4.5 meq/L (ref 3.5–5.3)
RDW: 14.2 % (ref 11.5–15.5)

## 2010-11-22 ENCOUNTER — Encounter (INDEPENDENT_AMBULATORY_CARE_PROVIDER_SITE_OTHER): Payer: Self-pay | Admitting: *Deleted

## 2010-11-22 ENCOUNTER — Ambulatory Visit: Payer: Self-pay | Admitting: Internal Medicine

## 2010-11-22 ENCOUNTER — Ambulatory Visit: Admit: 2010-11-22 | Payer: Self-pay | Admitting: Internal Medicine

## 2010-11-22 LAB — T-HELPER CELL (CD4) - (RCID CLINIC ONLY)
CD4 % Helper T Cell: 37 % (ref 33–55)
CD4 T Cell Abs: 360 uL — ABNORMAL LOW (ref 400–2700)

## 2010-11-22 NOTE — Miscellaneous (Signed)
Summary: RW Update  Clinical Lists Changes  Observations: Added new observation of RWPARTICIP: Yes (10/26/2010 10:15)

## 2010-11-22 NOTE — Miscellaneous (Signed)
Summary: ADAP Update  Clinical Lists Changes  Observations: Added new observation of AIDSDAP: No (09/28/2010 12:29) Added new observation of PAYOR: Medicaid (09/28/2010 12:29)

## 2010-11-28 ENCOUNTER — Encounter (INDEPENDENT_AMBULATORY_CARE_PROVIDER_SITE_OTHER): Payer: Self-pay | Admitting: *Deleted

## 2010-11-28 NOTE — Miscellaneous (Signed)
  Clinical Lists Changes  Observations: Added new observation of INCOMESOURCE: UNEMPLOYED (11/22/2010 13:47) Added new observation of HIV RISK BEH: Unknown (11/22/2010 13:47)

## 2010-12-05 ENCOUNTER — Ambulatory Visit: Payer: Self-pay | Admitting: Adult Health

## 2010-12-05 ENCOUNTER — Ambulatory Visit: Payer: Self-pay

## 2010-12-06 NOTE — Miscellaneous (Signed)
  Clinical Lists Changes 

## 2010-12-07 ENCOUNTER — Ambulatory Visit: Payer: Self-pay | Admitting: Adult Health

## 2010-12-11 ENCOUNTER — Ambulatory Visit (INDEPENDENT_AMBULATORY_CARE_PROVIDER_SITE_OTHER): Payer: Medicaid Other | Admitting: Adult Health

## 2010-12-11 DIAGNOSIS — B20 Human immunodeficiency virus [HIV] disease: Secondary | ICD-10-CM

## 2010-12-11 DIAGNOSIS — M25559 Pain in unspecified hip: Secondary | ICD-10-CM

## 2010-12-11 DIAGNOSIS — M1990 Unspecified osteoarthritis, unspecified site: Secondary | ICD-10-CM

## 2011-01-01 ENCOUNTER — Telehealth: Payer: Self-pay | Admitting: *Deleted

## 2011-01-01 LAB — T-HELPER CELL (CD4) - (RCID CLINIC ONLY): CD4 T Cell Abs: 350 uL — ABNORMAL LOW (ref 400–2700)

## 2011-01-08 NOTE — Progress Notes (Signed)
  Phone Note Call from Patient   Caller: Patient Summary of Call: he called asking for refills. asked that he notifiy his pharmacy. also states he "fell out" and lost his rx for oxycodone, which he got from [facility]. told him we could not help him with that one. told him he could call them & see what they will do. he agreed with plan Initial call taken by: Golden Circle RN,  January 01, 2011 3:41 PM

## 2011-01-15 ENCOUNTER — Telehealth: Payer: Self-pay | Admitting: *Deleted

## 2011-01-15 DIAGNOSIS — M25559 Pain in unspecified hip: Secondary | ICD-10-CM

## 2011-01-15 DIAGNOSIS — I10 Essential (primary) hypertension: Secondary | ICD-10-CM

## 2011-01-15 NOTE — Telephone Encounter (Signed)
He called to ask about his hip replacement surgery. States he has medicaid now. Says the pain is worse & now he has pus coming out of his hip x 1 wk. Told him he needs to be seen asap for this.  He will call & get an appt. His # is [id number]

## 2011-01-22 ENCOUNTER — Ambulatory Visit: Payer: Medicaid Other | Admitting: Adult Health

## 2011-01-28 ENCOUNTER — Telehealth: Payer: Self-pay | Admitting: *Deleted

## 2011-01-28 ENCOUNTER — Other Ambulatory Visit: Payer: Self-pay | Admitting: *Deleted

## 2011-01-28 ENCOUNTER — Encounter: Payer: Self-pay | Admitting: *Deleted

## 2011-01-28 MED ORDER — DILTIAZEM HCL 120 MG PO TABS: 120.0000 mg | ORAL_TABLET | Freq: Every day | ORAL | Status: DC

## 2011-01-28 MED ORDER — OXYCODONE HCL 10 MG PO TABS: 1.0000 | ORAL_TABLET | Freq: Four times a day (QID) | ORAL | Status: DC | PRN

## 2011-01-28 NOTE — Telephone Encounter (Signed)
He has appts here for lab & NP. States he has to have bp med refills & pain meds. Told him he may have to come get the rx for the pain meds. Uses [facility] [address] in [facility]. To B. Sundra Aland, NP

## 2011-01-28 NOTE — Telephone Encounter (Signed)
States he just got out of [facility]. Had gone to the ED. states bp was 190/?, asthma was acting up. Taking ibuprofen for pain but it is not helping much. No more pus from hip.

## 2011-01-28 NOTE — Telephone Encounter (Signed)
Sent referral to [facility] & called pt back about his hip pain. He has 2 rx here. He will come tomorrow & must sign a pain med contract before we give him the oxycodone rx. Told him I will let him know when the appt is

## 2011-01-28 NOTE — Telephone Encounter (Signed)
I have made the referral to [facility]. He was notified. He will pick up his rx & sign pain contract tomorrow

## 2011-02-05 ENCOUNTER — Other Ambulatory Visit: Payer: Medicaid Other

## 2011-02-07 ENCOUNTER — Telehealth: Payer: Self-pay | Admitting: *Deleted

## 2011-02-07 NOTE — Telephone Encounter (Signed)
[facility] is supplying Christopher Levine with his depend type garment and they needed a requisition signed and faxed back. Did it and got an OK as the result page

## 2011-02-19 ENCOUNTER — Ambulatory Visit: Payer: Medicaid Other | Admitting: Adult Health

## 2011-02-26 ENCOUNTER — Other Ambulatory Visit: Payer: Medicaid Other

## 2011-02-28 ENCOUNTER — Other Ambulatory Visit: Payer: Medicaid Other

## 2011-02-28 ENCOUNTER — Other Ambulatory Visit: Payer: Self-pay | Admitting: Licensed Clinical Social Worker

## 2011-02-28 ENCOUNTER — Telehealth: Payer: Self-pay | Admitting: Licensed Clinical Social Worker

## 2011-02-28 MED ORDER — ALBUTEROL SULFATE HFA 108 (90 BASE) MCG/ACT IN AERS: 1.0000 | INHALATION_SPRAY | Freq: Four times a day (QID) | RESPIRATORY_TRACT | Status: DC | PRN

## 2011-02-28 NOTE — Telephone Encounter (Signed)
Patient would like a refill on hydrocodone to pick up on Monday for hip pain, he was given a prescription for it last month  4/9/2012and now has 5 pills left.

## 2011-03-04 ENCOUNTER — Ambulatory Visit: Payer: Medicaid Other | Admitting: Adult Health

## 2011-03-05 ENCOUNTER — Telehealth: Payer: Self-pay | Admitting: *Deleted

## 2011-03-05 ENCOUNTER — Encounter: Payer: Self-pay | Admitting: Adult Health

## 2011-03-05 ENCOUNTER — Ambulatory Visit (INDEPENDENT_AMBULATORY_CARE_PROVIDER_SITE_OTHER): Payer: Medicaid Other | Admitting: Adult Health

## 2011-03-05 DIAGNOSIS — Z113 Encounter for screening for infections with a predominantly sexual mode of transmission: Secondary | ICD-10-CM

## 2011-03-05 DIAGNOSIS — B20 Human immunodeficiency virus [HIV] disease: Secondary | ICD-10-CM

## 2011-03-05 DIAGNOSIS — Z21 Asymptomatic human immunodeficiency virus [HIV] infection status: Secondary | ICD-10-CM

## 2011-03-05 DIAGNOSIS — Z01818 Encounter for other preprocedural examination: Secondary | ICD-10-CM

## 2011-03-05 DIAGNOSIS — M25559 Pain in unspecified hip: Secondary | ICD-10-CM

## 2011-03-05 DIAGNOSIS — J45909 Unspecified asthma, uncomplicated: Secondary | ICD-10-CM

## 2011-03-05 DIAGNOSIS — I10 Essential (primary) hypertension: Secondary | ICD-10-CM

## 2011-03-05 MED ORDER — OXYCODONE HCL 10 MG PO TABS: 1.0000 | ORAL_TABLET | Freq: Four times a day (QID) | ORAL | Status: DC | PRN

## 2011-03-05 MED ORDER — HYDROCHLOROTHIAZIDE 25 MG PO TABS: 25.0000 mg | ORAL_TABLET | Freq: Every day | ORAL | Status: DC

## 2011-03-05 MED ORDER — BECLOMETHASONE DIPROPIONATE 40 MCG/ACT IN AERS: 2.0000 | INHALATION_SPRAY | Freq: Two times a day (BID) | RESPIRATORY_TRACT | Status: DC

## 2011-03-05 NOTE — Progress Notes (Signed)
Subjective:    Patient ID: Christopher Levine, male    DOB: [birth year], 55 y.o.   MRN: [id number]  HPI Presents to clinic today to receive clearance for oral surgery. He has scheduled a full mouth extraction  03/14/2011. However, his chief presenting complaints remain to be severe left hip pain with even slight weightbearing. He was originally scheduled to be seen by [facility] orthopedics in April 2012. However, he claims he never received that appointment even though records indicated. He appointment was given to him. He is also continuing to complain of chest tightness, and wheezing in spite of the use of albuterol inhalers. Claims he remains adherent to the rest of his medications with no missed doses and good tolerance.   Review of Systems  Constitutional: Positive for activity change and fatigue. Negative for fever, chills, diaphoresis, appetite change and unexpected weight change.  HENT: Positive for drooling and dental problem. Negative for hearing loss, ear pain, nosebleeds, congestion, sore throat, facial swelling, rhinorrhea, sneezing, mouth sores, trouble swallowing, neck pain, neck stiffness, voice change, postnasal drip, sinus pressure, tinnitus and ear discharge.   Eyes: Negative for photophobia, pain, discharge, redness, itching and visual disturbance.  Respiratory: Positive for cough, chest tightness, shortness of breath and wheezing. Negative for apnea, choking and stridor.   Cardiovascular: Negative for chest pain, palpitations and leg swelling.  Gastrointestinal: Negative for nausea, vomiting, abdominal pain, diarrhea, constipation, blood in stool, abdominal distention, anal bleeding and rectal pain.  Genitourinary: Negative for dysuria, urgency, frequency, hematuria, flank pain, decreased urine volume, discharge, penile swelling, scrotal swelling, enuresis, difficulty urinating, genital sores, penile pain and testicular pain.  Musculoskeletal: Positive for myalgias, back pain,  arthralgias and gait problem. Negative for joint swelling.  Skin: Negative for color change, pallor, rash and wound.  Neurological: Negative for dizziness, tremors, seizures, syncope, facial asymmetry, speech difficulty, weakness, light-headedness, numbness and headaches.  Hematological: Negative for adenopathy. Does not bruise/bleed easily.  Psychiatric/Behavioral: Negative for suicidal ideas, hallucinations, behavioral problems, confusion, sleep disturbance, self-injury, dysphoric mood, decreased concentration and agitation. The patient is not nervous/anxious and is not hyperactive.        Objective:   Physical Exam  Constitutional: He is oriented to person, place, and time. He appears well-developed and well-nourished.       Appears in significant pain, and discomfort.  HENT:  Head: Normocephalic and atraumatic.       Both ear canals were impacted with cerumen. Oropharynx demonstrates multiple missing teeth with multiple caries to those teeth that remain.  Eyes: Conjunctivae and EOM are normal. Pupils are equal, round, and reactive to light. Right eye exhibits no discharge. Left eye exhibits no discharge. No scleral icterus.  Neck: Normal range of motion. Neck supple. No JVD present. No tracheal deviation present. No thyromegaly present.  Cardiovascular: Normal rate, regular rhythm, normal heart sounds and intact distal pulses.  Exam reveals no gallop and no friction rub.   No murmur heard. Pulmonary/Chest: No stridor. No respiratory distress. He has wheezes. He has no rales. He exhibits no tenderness.       Respirations tachypneic, with significant expiratory wheezes heard in both the upper anterior lung fields in the left posterior lung base.  Abdominal: Soft. Bowel sounds are normal. He exhibits no distension and no mass. There is no tenderness. There is no rebound and no guarding.       No hepatosplenomegaly noted  Musculoskeletal:       Left hip demonstrates point tenderness along the  greater trochanter line unable  to ambulate with either full weightbearing or even partial weightbearing. Currently ambulating with assistance of cane and a toe-touch fashion. Utilizing. Most weight with the right lower extremity. Flexion and rotation to the left hip in the recumbent position. Minimal due to pain.  Lymphadenopathy:    He has no cervical adenopathy.  Neurological: He is alert and oriented to person, place, and time. No cranial nerve deficit. He exhibits normal muscle tone. Coordination normal.  Skin: Skin is warm and dry. No rash noted. He is not diaphoretic. No erythema. No pallor.  Psychiatric: He has a normal mood and affect. His behavior is normal. Judgment and thought content normal.          Assessment & Plan:  1. HIV. Most recent labs we have available are from 11/21/2010, which showed a CD4 count of 360 at 37%, and a viral load of 53 copies per mL. We will need to do restaging labs on him today but we will continue his current medication regimen for now. We also requested that he come back in 10 weeks for repeat labs with a followup in 3 months. He verbally analysis information and agreed with plan of care.  2. Left Hip Pain. We made a second referral to [facility] for this Thursday to be evaluated regarding this. Meanwhile, we will refill his oxycodone 10 mg as previously prescribed for this time with the hopes that whatever intervention. Orthopedics provided for him. Will give him better relief. He was informed that he cannot miss this appointment. This week as he may not be able to be seen again. If he does.  3. Asthma. His asthma continues to persist refractory to albuterol MDI dosing. We will begin Qvar 40 mcg 2 puffs every 12 hours for now. If this doesn't help resolve the problem. We may be able to add a long-acting bronchodilator to his regimen.  4. Hypertension. His elevated blood pressure could be the result of both his hip pain, and his asthma.  Simultaneously. However, we will add hydrochlorothiazide 25 mg daily to his regimen and monitor his response.  5. Periodontal Disease. Scheduled for an FNB on 03/14/2011. Once we have evaluation of his labs, we will then determine clearance for his surgery.  Verbally acknowledged all information was provided for him, signed pain contract in receipt of oxycodone, and verbally and agreed with plan of care.

## 2011-03-05 NOTE — Telephone Encounter (Signed)
Referral appt.  [facility], Dr. Turner Daniels, Mar 07, 2011 @ 1530.  Pt. Given card with appt information.   Pt arranging transportation for appt with his brother-in-law.  Jennet Maduro, RN

## 2011-03-06 LAB — T-HELPER CELL (CD4) - (RCID CLINIC ONLY)
CD4 % Helper T Cell: 38 % (ref 33–55)
CD4 T Cell Abs: 430 uL (ref 400–2700)

## 2011-03-06 LAB — COMPLETE METABOLIC PANEL WITH GFR
AST: 16 U/L (ref 0–37)
BUN: 15 mg/dL (ref 6–23)
Calcium: 9.3 mg/dL (ref 8.4–10.5)
Chloride: 105 mEq/L (ref 96–112)
Creat: 1.4 mg/dL (ref 0.40–1.50)
GFR, Est African American: >60 mL/min (ref >60)
GFR, Est Non African American: 53 mL/min — ABNORMAL LOW (ref >60)
Glucose, Bld: 104 mg/dL — ABNORMAL HIGH (ref 70–99)

## 2011-03-06 LAB — URINALYSIS, ROUTINE W REFLEX MICROSCOPIC
Hgb urine dipstick: NEGATIVE
Protein, ur: >300 mg/dL — AB
Urobilinogen, UA: 0.2 mg/dL (ref 0.0–1.0)

## 2011-03-06 LAB — URINALYSIS, MICROSCOPIC ONLY
Casts: NONE SEEN
Crystals: NONE SEEN
Squamous Epithelial / LPF: NONE SEEN

## 2011-03-06 LAB — HIV-1 RNA QUANT-NO REFLEX-BLD: HIV 1 RNA Quant: 166 copies/mL — ABNORMAL HIGH (ref <20)

## 2011-03-06 LAB — CBC WITH DIFFERENTIAL/PLATELET
Basophils Relative: 0 % (ref 0–1)
Eosinophils Absolute: 0.3 10*3/uL (ref 0.0–0.7)
MCH: 30.6 pg (ref 26.0–34.0)
MCHC: 34.1 g/dL (ref 30.0–36.0)
Monocytes Relative: 11 % (ref 3–12)
Neutrophils Relative %: 65 % (ref 43–77)
Platelets: 234 10*3/uL (ref 150–400)

## 2011-03-06 LAB — PROTIME-INR
INR: 0.95 (ref <1.50)
Prothrombin Time: 12.9 seconds (ref 11.6–15.2)

## 2011-03-07 ENCOUNTER — Encounter: Payer: Self-pay | Admitting: Adult Health

## 2011-03-07 NOTE — Progress Notes (Signed)
Labs have been reviewed and he is cleared for oral surgery.

## 2011-03-07 NOTE — Progress Notes (Signed)
  Subjective:    Patient ID: Christopher Levine, male    DOB: 10/04/1956, 55 y.o.   MRN: [id number]  HPI    Review of Systems     Objective:   Physical Exam        Assessment & Plan:

## 2011-03-11 ENCOUNTER — Ambulatory Visit: Payer: Medicaid Other | Admitting: Adult Health

## 2011-03-14 ENCOUNTER — Ambulatory Visit: Payer: Medicaid Other | Admitting: Adult Health

## 2011-04-08 ENCOUNTER — Other Ambulatory Visit: Payer: Self-pay | Admitting: *Deleted

## 2011-04-08 ENCOUNTER — Telehealth: Payer: Self-pay | Admitting: *Deleted

## 2011-04-08 ENCOUNTER — Encounter (HOSPITAL_COMMUNITY)
Admission: RE | Admit: 2011-04-08 | Discharge: 2011-04-08 | Disposition: A | Discharge status: Home / Self Care | Payer: Medicaid Other | Source: Ambulatory Visit | Attending: Orthopedic Surgery | Admitting: Orthopedic Surgery

## 2011-04-08 DIAGNOSIS — I10 Essential (primary) hypertension: Secondary | ICD-10-CM

## 2011-04-08 DIAGNOSIS — M25559 Pain in unspecified hip: Secondary | ICD-10-CM

## 2011-04-08 LAB — URINALYSIS, ROUTINE W REFLEX MICROSCOPIC
Bilirubin Urine: NEGATIVE
Ketones, ur: NEGATIVE mg/dL
Leukocytes, UA: NEGATIVE
Nitrite: NEGATIVE
Protein, ur: 100 mg/dL — AB
Urobilinogen, UA: 0.2 mg/dL (ref 0.0–1.0)
pH: 6.5 (ref 5.0–8.0)

## 2011-04-08 LAB — BASIC METABOLIC PANEL
Chloride: 102 mEq/L (ref 96–112)
Creatinine, Ser: 1.22 mg/dL (ref 0.50–1.35)
GFR calc Af Amer: >60 mL/min (ref >60)
GFR calc non Af Amer: >60 mL/min (ref >60)

## 2011-04-08 LAB — PROTIME-INR: INR: 0.9 (ref 0.00–1.49)

## 2011-04-08 LAB — ABO/RH: ABO/RH(D): O POS

## 2011-04-08 LAB — DIFFERENTIAL
Basophils Relative: 1 % (ref 0–1)
Eosinophils Absolute: 0.2 10*3/uL (ref 0.0–0.7)
Lymphs Abs: 1.4 10*3/uL (ref 0.7–4.0)
Monocytes Absolute: 0.5 10*3/uL (ref 0.1–1.0)
Monocytes Relative: 11 % (ref 3–12)

## 2011-04-08 LAB — CBC
MCHC: 36 g/dL (ref 30.0–36.0)
MCV: 86.9 fL (ref 78.0–100.0)
Platelets: 230 10*3/uL (ref 150–400)
RDW: 12.8 % (ref 11.5–15.5)
WBC: 4.1 10*3/uL (ref 4.0–10.5)

## 2011-04-08 MED ORDER — HYDROCHLOROTHIAZIDE 25 MG PO TABS: 25.0000 mg | ORAL_TABLET | Freq: Every day | ORAL | Status: DC

## 2011-04-08 MED ORDER — OXYCODONE HCL 10 MG PO TABS: 1.0000 | ORAL_TABLET | Freq: Four times a day (QID) | ORAL | Status: DC | PRN

## 2011-04-08 NOTE — Telephone Encounter (Signed)
Patient will be in the area today and wanted to come by to pick up these Rx's.

## 2011-04-09 ENCOUNTER — Other Ambulatory Visit: Payer: Self-pay | Admitting: *Deleted

## 2011-04-09 DIAGNOSIS — B20 Human immunodeficiency virus [HIV] disease: Secondary | ICD-10-CM

## 2011-04-09 MED ORDER — EMTRICITABINE-TENOFOVIR DF 200-300 MG PO TABS: 1.0000 | ORAL_TABLET | Freq: Every day | ORAL | Status: DC

## 2011-04-09 MED ORDER — DARUNAVIR ETHANOLATE 400 MG PO TABS: 800.0000 mg | ORAL_TABLET | Freq: Every day | ORAL | Status: DC

## 2011-04-09 MED ORDER — RITONAVIR 100 MG PO CAPS: 100.0000 mg | ORAL_CAPSULE | Freq: Every day | ORAL | Status: DC

## 2011-04-10 NOTE — Telephone Encounter (Signed)
Refill done previously. Account opened in error

## 2011-04-15 ENCOUNTER — Inpatient Hospital Stay (HOSPITAL_COMMUNITY): Payer: Medicaid Other

## 2011-04-15 ENCOUNTER — Inpatient Hospital Stay (HOSPITAL_COMMUNITY)
Admission: RE | Admit: 2011-04-15 | Discharge: 2011-04-22 | DRG: 470 | Disposition: A | Discharge status: Skilled Nursing Facility | Payer: Medicaid Other | Source: Ambulatory Visit | Attending: Orthopedic Surgery | Admitting: Orthopedic Surgery

## 2011-04-15 ENCOUNTER — Other Ambulatory Visit: Payer: Self-pay | Admitting: Orthopedic Surgery

## 2011-04-15 DIAGNOSIS — M1610 Unilateral primary osteoarthritis, unspecified hip: Principal | ICD-10-CM | POA: Diagnosis present

## 2011-04-15 DIAGNOSIS — D62 Acute posthemorrhagic anemia: Secondary | ICD-10-CM | POA: Diagnosis not present

## 2011-04-15 DIAGNOSIS — M169 Osteoarthritis of hip, unspecified: Principal | ICD-10-CM | POA: Diagnosis present

## 2011-04-15 DIAGNOSIS — J45909 Unspecified asthma, uncomplicated: Secondary | ICD-10-CM | POA: Diagnosis present

## 2011-04-15 DIAGNOSIS — I10 Essential (primary) hypertension: Secondary | ICD-10-CM | POA: Diagnosis present

## 2011-04-15 DIAGNOSIS — Z21 Asymptomatic human immunodeficiency virus [HIV] infection status: Secondary | ICD-10-CM | POA: Diagnosis present

## 2011-04-15 DIAGNOSIS — Z01812 Encounter for preprocedural laboratory examination: Secondary | ICD-10-CM

## 2011-04-16 LAB — CBC
HCT: 24.2 % — ABNORMAL LOW (ref 39.0–52.0)
MCH: 30.2 pg (ref 26.0–34.0)
MCV: 86.1 fL (ref 78.0–100.0)
Platelets: 219 10*3/uL (ref 150–400)
RBC: 2.81 MIL/uL — ABNORMAL LOW (ref 4.22–5.81)
RDW: 13 % (ref 11.5–15.5)
WBC: 10.3 10*3/uL (ref 4.0–10.5)

## 2011-04-16 LAB — BASIC METABOLIC PANEL
BUN: 19 mg/dL (ref 6–23)
CO2: 24 mEq/L (ref 19–32)
Calcium: 8.3 mg/dL — ABNORMAL LOW (ref 8.4–10.5)
Chloride: 99 mEq/L (ref 96–112)
Creatinine, Ser: 1.49 mg/dL — ABNORMAL HIGH (ref 0.50–1.35)

## 2011-04-16 NOTE — Op Note (Signed)
NAMEMORTY, ORTWEIN NO.:  [id number]  MEDICAL RECORD NO.:  [id number]  LOCATION:  5030                         FACILITY:  [facility]  PHYSICIAN:  Feliberto Gottron. Turner Daniels, M.D.   DATE OF BIRTH:  03-Jun-1956  DATE OF PROCEDURE:  04/15/2011 DATE OF DISCHARGE:                              OPERATIVE REPORT   PREOPERATIVE DIAGNOSIS:  End-stage arthritis of left hip.  POSTOPERATIVE DIAGNOSIS:  End-stage arthritis of left hip.  PROCEDURE:  Left total hip arthroplasty using DePuy 54-mm pinnacle cup with one central occluder, 10-degree polyethylene liner index superior and posterior to accept a +0 36-mm ceramic head on a 20 x 15 x 165 x 42 S-ROM stem 20 F small cone.  SURGEON:  Feliberto Gottron. Turner Daniels, MD  FIRST ASSISTANT:  Shirl Harris, PA-C  ANESTHETIC:  General endotracheal.  ESTIMATED BLOOD LOSS:  400 mL  FLUID REPLACEMENT:  1500 mL of crystalloid.  DRAINS PLACED:  Foley catheter.  URINE OUTPUT:  300 mL.  INDICATIONS FOR PROCEDURE:  A 55 year old man with end-stage arthritis left greater than right hip.  Plain radiographs show loss of articular cartilage, subchondral cysts, and some superior and lateral subluxation of the left femoral head in relation to the right.  He is also HIV positive, but has never had to antiviral medicines and has a normal T cell count.  He has failed conservative measures with anti-inflammatory medicines and physical therapy, and desires elective left total hip arthroplasty.  Risks and benefits of surgery have been discussed, questions answered.  DESCRIPTION OF PROCEDURE:  The patient was identified by armband and taken to the holding area at [facility], and a preoperative IV antibiotics were administered, 2 g Ancef, taken to operating room 15. Appropriate anesthetic monitors were attached and general endotracheal anesthesia was induced with the patient in supine position.  At this point, a Foley catheter was inserted.  He was rolled  into the right lateral decubitus position and fixed there with a Stulberg Mark II pelvic clamp.  The left lower extremity was then prepped in draped in sterile fashion from the ankle to the hemipelvis.  Time-out procedure performed.  We began the operation by making a 15-cm incision through the skin and subcutaneous tissue allowing a posterolateral approach to the hip joint.  Once the IT band was incised in line with skin incision exposing the greater trochanter, and Cobra retractors were placed between the gluteus minimus and the superior hip joint capsule and the quadratus femoris and the inferior hip joint capsule isolating the piriformis and short external rotators, which were then tagged with a #2 Ethibond suture and cut off their insertion on the intertrochanteric crest.  We then identified the posterior aspect of the hip joint capsule, which was developed into an acetabular based flap and tagged with #2 Ethibond sutures once superior and one inferior.  Once the flap had been peeled back, the femoral head was identified and had severe arthritis with a fair amount of synovitis and because of this, some of the synovium was sent off the lab for Gram stain culture and permanent sections.  There was no purulent fluid, and the joint fluid appeared to be rather thin,  slightly blood tinged.  The femoral head was dislocated, standard neck cut performed one fingerbreadth above the lesser trochanter, and the proximal femur was then translated anteriorly levering off the anterior column with a Hohmann retractor.  A spiked Cobra was placed in the cotyloid notch, and a posterior inferior wing retractor at the junction of the ischium and the acetabulum.  We then removed the labrum and some peripheral osteophytes and reamed the acetabulum up to a 53-mm basket reamer obtaining good coverage in all quadrants.  The acetabulum was irrigated out with normal saline solution, and a 54-mm DePuy pinnacle  cup with one single center hole was hammered into place with good fit and fill, and you could lift the pelvis off the table with handle attached.  More peripheral osteophytes were then removed.  At this point, a trial 10-degree polyethylene liner was screwed into place with the index superior and posterior, and we then directed our attention to the femur.  The hip was flexed and internally rotated exposing the proximal femur, which was entered with a box cutting chisel with initiating reamer actually reamed with 8, 10, 12, and 14 reamers, and then in 0.5 mm increments, we went up to a 15.5 mm reamer obtaining good chatter.  We went halfway down with a 16-mm reamer, reamed the cones up to a 20 F cone to the appropriate depth for a 42 base neck, and then milled the calcar to a small.  A trial 20 F small cone was then hammered into place followed by a trial stem with a 42 base neck and a +0 36 mm head.  The hip was reduced and stability was found to be excellent to 90 of flexion with 70 of internal rotation and extension and external rotation of 40 degrees, the hip could not be dislocated.  It should be noted that when we hammered the metal shell in, the abduction was 40 and the anteversion about 20.  At this point, all trial components were removed.  A central occluder was placed, a real polyethylene liner to accept a 36 head was then hammered into place with the index posterior and superior.  The femur was irrigated out with normal saline solution.  A 20 F small cone was hammered into place followed by 20 x 15 x 165 x 42 stem, and the same version as a calcar about 20 degrees.  A +0, 36-mm ceramic head was then hammered on the stem and the hip reduced.  We checked our stability.  It was once again excellent.  The wound was irrigated out with normal saline solution. Capsular flap and short external rotators repaired back to the intertrochanteric crest through drill holes using the #2  Ethibond suture.  Small bleeders were then carefully once again checked for.  The IT band was closed with running #1 Vicryl suture, the subcutaneous tissue with 2-0 Vicryl sutures since the patient was relatively slender, and the skin with running interlocking 3-0 nylon suture.  A dressing of Xeroform and Mepilex was then applied.  The patient was unclamped, rolled supine, awakened, extubated, and taken to the recovery room without difficulty.     Feliberto Gottron. Turner Daniels, M.D.     Ovid Curd  D:  04/15/2011  T:  04/16/2011  Job:  [id number]  Electronically Signed by Gean Birchwood M.D. on 04/16/2011 06:39:55 AM

## 2011-04-17 LAB — CBC
HCT: 18.1 % — ABNORMAL LOW (ref 39.0–52.0)
Hemoglobin: 6.5 g/dL — CL (ref 13.0–17.0)
MCH: 30.8 pg (ref 26.0–34.0)
MCHC: 35.9 g/dL (ref 30.0–36.0)
MCV: 85.8 fL (ref 78.0–100.0)
Platelets: 194 10*3/uL (ref 150–400)
RBC: 2.11 MIL/uL — ABNORMAL LOW (ref 4.22–5.81)
RDW: 13 % (ref 11.5–15.5)
WBC: 7.5 10*3/uL (ref 4.0–10.5)

## 2011-04-17 LAB — PREPARE RBC (CROSSMATCH)

## 2011-04-18 LAB — CBC
HCT: 20.4 % — ABNORMAL LOW (ref 39.0–52.0)
Hemoglobin: 7.2 g/dL — ABNORMAL LOW (ref 13.0–17.0)
MCH: 29.5 pg (ref 26.0–34.0)
MCHC: 35.3 g/dL (ref 30.0–36.0)
MCV: 83.6 fL (ref 78.0–100.0)
RDW: 14.3 % (ref 11.5–15.5)

## 2011-04-18 LAB — TISSUE CULTURE: Culture: NO GROWTH

## 2011-04-19 LAB — TYPE AND SCREEN
Antibody Screen: NEGATIVE
Unit division: 0
Unit division: 0

## 2011-04-19 LAB — PROTIME-INR: Prothrombin Time: 18.1 seconds — ABNORMAL HIGH (ref 11.6–15.2)

## 2011-04-19 LAB — CBC
HCT: 22.8 % — ABNORMAL LOW (ref 39.0–52.0)
Hemoglobin: 8.2 g/dL — ABNORMAL LOW (ref 13.0–17.0)
MCH: 30.1 pg (ref 26.0–34.0)
MCV: 83.8 fL (ref 78.0–100.0)
RBC: 2.72 MIL/uL — ABNORMAL LOW (ref 4.22–5.81)

## 2011-04-20 LAB — CBC
MCH: 28.8 pg (ref 26.0–34.0)
MCHC: 34.5 g/dL (ref 30.0–36.0)
MCV: 83.5 fL (ref 78.0–100.0)
Platelets: 259 10*3/uL (ref 150–400)
RDW: 13.6 % (ref 11.5–15.5)

## 2011-04-21 LAB — PROTIME-INR: INR: 1.52 — ABNORMAL HIGH (ref 0.00–1.49)

## 2011-04-22 LAB — CROSSMATCH
ABO/RH(D): O POS
Antibody Screen: NEGATIVE
Unit division: 0

## 2011-04-22 LAB — CBC
HCT: 27.6 % — ABNORMAL LOW (ref 39.0–52.0)
MCH: 28.8 pg (ref 26.0–34.0)
MCV: 82.9 fL (ref 78.0–100.0)
Platelets: 292 10*3/uL (ref 150–400)
RDW: 13.7 % (ref 11.5–15.5)

## 2011-04-22 NOTE — Discharge Summary (Signed)
Christopher Levine, Christopher Levine NO.:  [id number]  MEDICAL RECORD NO.:  [id number]  LOCATION:  5030                         FACILITY:  [facility]  PHYSICIAN:  Feliberto Gottron. Turner Daniels, M.D.   DATE OF BIRTH:  1955/12/03  DATE OF ADMISSION:  04/15/2011 DATE OF DISCHARGE:  04/19/2011                              DISCHARGE SUMMARY   CHIEF COMPLAINT:  Left hip pain.  HISTORY OF PRESENT ILLNESS:  This is a 55 year old gentleman who complains of severe unremitting pain in his left hip despite conservative treatment and activity modification.  He now desires a surgical intervention.  All risks and benefits of surgery were discussed with the patient.  PAST MEDICAL HISTORY:  Significant for: 1. HIV. 2. Asthma. 3. Hypertension.  PAST SURGICAL HISTORY:  Significant for a tooth extraction.  ALLERGIES:  He has no known drug allergies.  SOCIAL HISTORY:  He denies use of alcohol or tobacco use.  He is on disability.  FAMILY HISTORY:  Noncontributory.  PHYSICAL EXAMINATION:  EXTREMITIES:  Gross examination of the left hip demonstrates bone-on-bone degenerative joint disease.  He has significant pain with internal rotation is neurovascularly intact.  PREOPERATIVE LABORATORY DATA:  White blood cells 4.1, red blood cells 4.80, hemoglobin 15, hematocrit 41.7, platelets 230.  PT 12.3, INR 0.90, PTT 27.  Sodium 138, potassium 4.2, chloride 102, glucose 84, BUN 18, creatinine 1.22.  Urinalysis was within normal limits.  HOSPITAL COURSE:  Christopher Levine was admitted to [facility] on April 15, 2011 when he underwent left total hip arthroplasty.  The procedure was performed by Dr. Gean Birchwood and the patient tolerated it well.  A perioperative Foley catheter was placed and he was transferred to the floor on Lovenox and Coumadin for DVT prophylaxis.  On the first postoperative day, he was awake and alert and reporting good pain control.  He was voiding well.  On postoperative day 2, he complained  of fatigue.  Hemoglobin was 6.5, so he was transfused with 2 units of packed red blood cells.  On the third postoperative day, his hemoglobin was 7.2, so he was given an additional unit of blood.  The dizziness and fatigue from his anemia limited his physical therapy progress on these dates.  By the fourth postoperative day, he was eating well, his hemoglobin had increased to 8.2, and he continued to report good pain control.  He was discharged to a skilled nursing facility for rehab.  DISPOSITION:  The patient was discharged to skilled nursing on April 19, 2011.  He was weightbearing as tolerated.  He did remain on Coumadin for a total of 14 days with a target INR of 1.5-2.0.  This will be managed by the skilled nursing center.  They will also manage his physical therapy.  He will return to the clinic to see Dr. Turner Daniels on postoperative day 14 for x-rays and suture removal.  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     Shirl Harris, PA______________________________ Feliberto Gottron. Turner Daniels, M.D.    JW/MEDQ  D:  04/19/2011  T:  04/19/2011  Job:  [id number]  Electronically Signed by Victorino Dike  Mayford Knife PA on 04/22/2011 09:46:54 AM Electronically Signed by Gean Birchwood M.D. on 04/22/2011 10:24:33 PM

## 2011-05-09 ENCOUNTER — Other Ambulatory Visit: Payer: Self-pay | Admitting: *Deleted

## 2011-05-09 ENCOUNTER — Telehealth: Payer: Self-pay | Admitting: Licensed Clinical Social Worker

## 2011-05-09 DIAGNOSIS — I10 Essential (primary) hypertension: Secondary | ICD-10-CM

## 2011-05-09 NOTE — Telephone Encounter (Signed)
Patient just had hip surgery and was calling to ask for pain medications, I told him he needed to call his Orthopedic who did his surgery. Patient acknowledged understanding.

## 2011-05-09 NOTE — Telephone Encounter (Signed)
Returned call to the patient. He called to have a refill on his pain meds. After reviewing his chart with the provider, was told to advise the patient to call his ortho surgeon as he had surgery on 6/25 and is still under his care. And he is also on blood thinners at this time. Our provider will not provide him any pain meds at this time.

## 2011-05-16 ENCOUNTER — Other Ambulatory Visit: Payer: Self-pay | Admitting: Licensed Clinical Social Worker

## 2011-05-16 ENCOUNTER — Other Ambulatory Visit: Payer: Self-pay | Admitting: *Deleted

## 2011-05-16 DIAGNOSIS — I10 Essential (primary) hypertension: Secondary | ICD-10-CM

## 2011-05-16 MED ORDER — DILTIAZEM HCL 120 MG PO TABS: 120.0000 mg | ORAL_TABLET | Freq: Every day | ORAL | Status: DC

## 2011-05-16 MED ORDER — HYDROCHLOROTHIAZIDE 25 MG PO TABS: 25.0000 mg | ORAL_TABLET | Freq: Every day | ORAL | Status: DC

## 2011-05-17 ENCOUNTER — Telehealth: Payer: Self-pay | Admitting: *Deleted

## 2011-05-17 NOTE — Telephone Encounter (Signed)
Second request by pt for pain medication following hip surgery.  Pt previously advised to contact orthopedic surgeon for post-operative pain management.  RN advised pt to contact Dr. Wadie Lessen office for pain rx refill.  Message left to this effect.  Jennet Maduro, RN

## 2011-05-27 ENCOUNTER — Other Ambulatory Visit: Payer: Medicaid Other

## 2011-06-05 ENCOUNTER — Ambulatory Visit: Payer: Medicaid Other | Admitting: Adult Health

## 2011-06-10 ENCOUNTER — Ambulatory Visit: Payer: Medicaid Other | Admitting: Adult Health

## 2011-06-12 ENCOUNTER — Other Ambulatory Visit: Payer: Medicaid Other

## 2011-06-13 ENCOUNTER — Other Ambulatory Visit: Payer: Medicaid Other

## 2011-06-25 ENCOUNTER — Other Ambulatory Visit: Payer: Self-pay | Admitting: Adult Health

## 2011-06-25 ENCOUNTER — Other Ambulatory Visit: Payer: Medicaid Other

## 2011-06-25 DIAGNOSIS — B20 Human immunodeficiency virus [HIV] disease: Secondary | ICD-10-CM

## 2011-06-26 ENCOUNTER — Ambulatory Visit: Payer: Medicaid Other | Admitting: Adult Health

## 2011-07-09 ENCOUNTER — Ambulatory Visit: Payer: Medicaid Other | Admitting: Adult Health

## 2011-08-31 IMAGING — CR DG PORTABLE PELVIS
1 series · 1 of 1 positions shown · non-contrast
Comparison: None

CLINICAL DATA: Left hip replacement surgery.

PORTABLE PELVIS

[view not recorded]
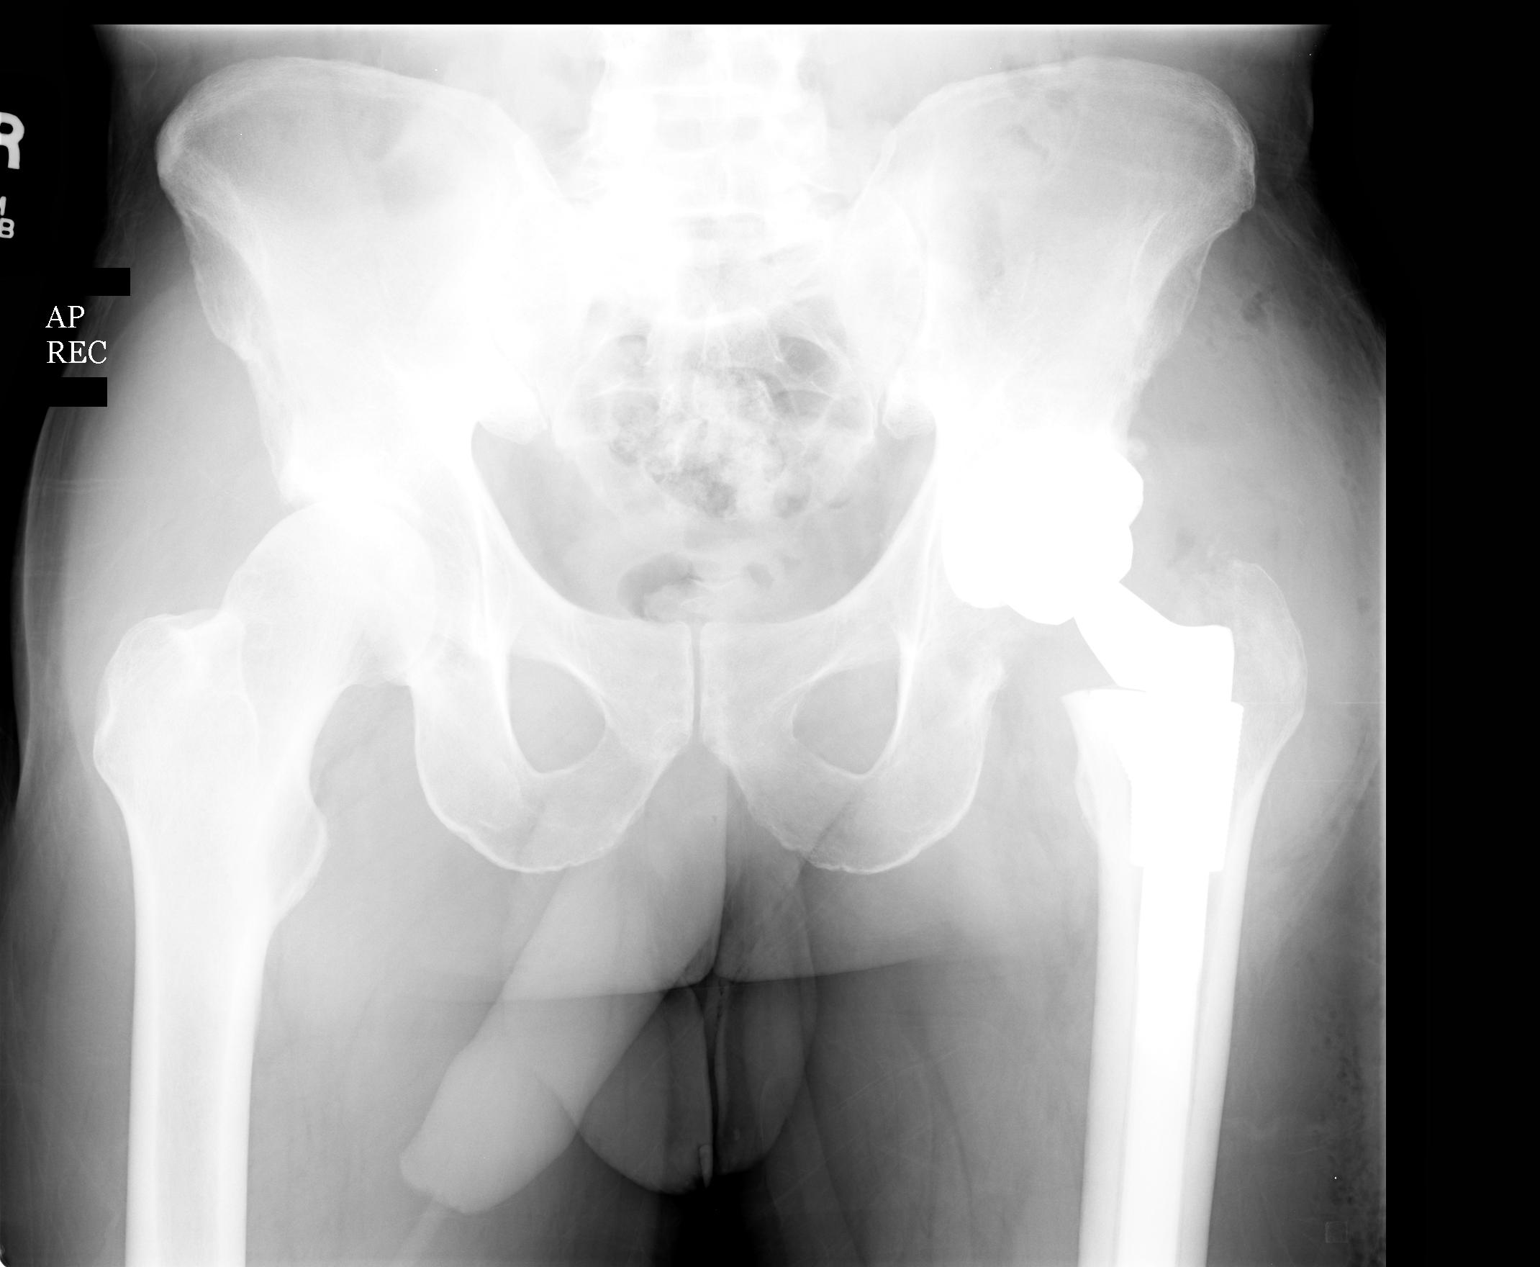

[1 of 1 positions shown; findings below may reference images not displayed]

FINDINGS: The femoral and acetabular components are well seated.
No complicating features.  The right hip demonstrates mild
degenerative changes.  The bony pelvis is intact.
IMPRESSION: Well seated components of a total left hip arthroplasty without
complicating features.

## 2011-10-28 ENCOUNTER — Other Ambulatory Visit: Payer: Self-pay | Admitting: Infectious Diseases

## 2011-10-28 ENCOUNTER — Other Ambulatory Visit (INDEPENDENT_AMBULATORY_CARE_PROVIDER_SITE_OTHER): Payer: Medicaid Other

## 2011-10-28 ENCOUNTER — Other Ambulatory Visit: Payer: Self-pay | Admitting: *Deleted

## 2011-10-28 DIAGNOSIS — I10 Essential (primary) hypertension: Secondary | ICD-10-CM

## 2011-10-28 DIAGNOSIS — B20 Human immunodeficiency virus [HIV] disease: Secondary | ICD-10-CM

## 2011-10-28 LAB — CBC WITH DIFFERENTIAL/PLATELET
Basophils Absolute: 0 10*3/uL (ref 0.0–0.1)
Basophils Relative: 1 % (ref 0–1)
Eosinophils Absolute: 0.1 10*3/uL (ref 0.0–0.7)
HCT: 47.5 % (ref 39.0–52.0)
Hemoglobin: 16.4 g/dL (ref 13.0–17.0)
MCH: 31.6 pg (ref 26.0–34.0)
MCHC: 34.5 g/dL (ref 30.0–36.0)
Monocytes Absolute: 0.5 10*3/uL (ref 0.1–1.0)
Monocytes Relative: 12 % (ref 3–12)
Neutro Abs: 2.6 10*3/uL (ref 1.7–7.7)
RDW: 13 % (ref 11.5–15.5)

## 2011-10-28 LAB — COMPREHENSIVE METABOLIC PANEL
AST: 24 U/L (ref 0–37)
Alkaline Phosphatase: 62 U/L (ref 39–117)
BUN: 15 mg/dL (ref 6–23)
Creat: 1.47 mg/dL — ABNORMAL HIGH (ref 0.50–1.35)
Glucose, Bld: 99 mg/dL (ref 70–99)
Potassium: 4.6 mEq/L (ref 3.5–5.3)
Total Bilirubin: 0.7 mg/dL (ref 0.3–1.2)

## 2011-10-28 MED ORDER — DILTIAZEM HCL 120 MG PO TABS: 120.0000 mg | ORAL_TABLET | Freq: Every day | ORAL | Status: DC

## 2011-10-28 NOTE — Telephone Encounter (Signed)
Pt here for lab work.  Requesting refill for b/p medications.

## 2011-10-29 LAB — T-HELPER CELL (CD4) - (RCID CLINIC ONLY)
CD4 % Helper T Cell: 46 % (ref 33–55)
CD4 T Cell Abs: 430 uL (ref 400–2700)

## 2011-10-30 LAB — HIV-1 RNA QUANT-NO REFLEX-BLD: HIV-1 RNA Quant, Log: <1.3 {Log} (ref <1.30)

## 2011-11-18 ENCOUNTER — Ambulatory Visit: Payer: Medicaid Other | Admitting: Infectious Disease

## 2011-11-20 ENCOUNTER — Ambulatory Visit (INDEPENDENT_AMBULATORY_CARE_PROVIDER_SITE_OTHER): Payer: Medicaid Other | Admitting: Infectious Disease

## 2011-11-20 ENCOUNTER — Encounter: Payer: Self-pay | Admitting: Infectious Disease

## 2011-11-20 VITALS — BP 158/95 | HR 80 | Temp 98.1°F | Ht 71.0 in | Wt 194.0 lb

## 2011-11-20 DIAGNOSIS — N189 Chronic kidney disease, unspecified: Secondary | ICD-10-CM

## 2011-11-20 DIAGNOSIS — I10 Essential (primary) hypertension: Secondary | ICD-10-CM

## 2011-11-20 DIAGNOSIS — Z96649 Presence of unspecified artificial hip joint: Secondary | ICD-10-CM

## 2011-11-20 DIAGNOSIS — Z23 Encounter for immunization: Secondary | ICD-10-CM

## 2011-11-20 DIAGNOSIS — N529 Male erectile dysfunction, unspecified: Secondary | ICD-10-CM

## 2011-11-20 DIAGNOSIS — B20 Human immunodeficiency virus [HIV] disease: Secondary | ICD-10-CM

## 2011-11-20 MED ORDER — DARUNAVIR ETHANOLATE 800 MG PO TABS: 800.0000 mg | ORAL_TABLET | Freq: Every day | ORAL | Status: DC

## 2011-11-20 MED ORDER — SILDENAFIL CITRATE 50 MG PO TABS: 50.0000 mg | ORAL_TABLET | Freq: Every day | ORAL | Status: AC | PRN

## 2011-11-20 MED ORDER — DILTIAZEM HCL ER 180 MG PO CP24: 180.0000 mg | ORAL_CAPSULE | Freq: Every day | ORAL | Status: DC

## 2011-11-20 NOTE — Assessment & Plan Note (Signed)
Recovering well from surgery 

## 2011-11-20 NOTE — Assessment & Plan Note (Signed)
Perfect control!. Changed to prezista to 800mg  > with creatinine creep may ultimately need change to qod truvada or change to epzicom. Check [phone]

## 2011-11-20 NOTE — Assessment & Plan Note (Signed)
Check tesosterone, tsh, will give viagra

## 2011-11-20 NOTE — Assessment & Plan Note (Signed)
Tighten up bp control. Check microablumin to creatinine ratio

## 2011-11-20 NOTE — Progress Notes (Signed)
  Subjective:    Patient ID: Christopher Levine, male    DOB: May 11, 1956, 56 y.o.   MRN: [id number]  HPI  56 year old [place] man with HIV very well controlled on his current regimen, but with suboptimally controlled BP. He and his girlfriend with whom he is having protected intercourse are requesting viagra or cialis for ED. I spent greater than 45 minutes with the patient including greater than 50% of time in face to face counsel of the patient and in coordination of their care.    Review of Systems  Constitutional: Negative for fever, chills, diaphoresis, activity change, appetite change, fatigue and unexpected weight change.  HENT: Negative for congestion, sore throat, rhinorrhea, sneezing, trouble swallowing and sinus pressure.   Eyes: Negative for photophobia and visual disturbance.  Respiratory: Negative for cough, chest tightness, shortness of breath, wheezing and stridor.   Cardiovascular: Negative for chest pain, palpitations and leg swelling.  Gastrointestinal: Negative for nausea, vomiting, abdominal pain, diarrhea, constipation, blood in stool, abdominal distention and anal bleeding.  Genitourinary: Negative for dysuria, hematuria, flank pain and difficulty urinating.  Musculoskeletal: Negative for myalgias, back pain, joint swelling, arthralgias and gait problem.  Skin: Negative for color change, pallor, rash and wound.  Neurological: Negative for dizziness, tremors, weakness and light-headedness.  Hematological: Negative for adenopathy. Does not bruise/bleed easily.  Psychiatric/Behavioral: Negative for behavioral problems, confusion, sleep disturbance, dysphoric mood, decreased concentration and agitation.       Objective:   Physical Exam  Constitutional: He is oriented to person, place, and time. He appears well-developed and well-nourished. No distress.  HENT:  Head: Normocephalic and atraumatic.  Mouth/Throat: Oropharynx is clear and moist. No oropharyngeal  exudate.  Eyes: Conjunctivae and EOM are normal. Pupils are equal, round, and reactive to light. No scleral icterus.  Neck: Normal range of motion. Neck supple. No JVD present.  Cardiovascular: Normal rate, regular rhythm and normal heart sounds.  Exam reveals no gallop and no friction rub.   No murmur heard. Pulmonary/Chest: Effort normal and breath sounds normal. No respiratory distress. He has no wheezes. He has no rales. He exhibits no tenderness.  Abdominal: He exhibits no distension and no mass. There is no tenderness. There is no rebound and no guarding.  Musculoskeletal: He exhibits no edema and no tenderness.  Lymphadenopathy:    He has no cervical adenopathy.  Neurological: He is alert and oriented to person, place, and time. He has normal reflexes. He exhibits normal muscle tone. Coordination normal.  Skin: Skin is warm and dry. He is not diaphoretic. No erythema. No pallor.  Psychiatric: He has a normal mood and affect. His behavior is normal. Judgment and thought content normal.          Assessment & Plan:  HIV DISEASE Perfect control!. Changed to prezista to 800mg  > with creatinine creep may ultimately need change to qod truvada or change to epzicom. Check [id number]  Erectile dysfunction Check tesosterone, tsh, will give viagra  CKD (chronic kidney disease) Tighten up bp control. Check microablumin to creatinine ratio  S/P hip replacement Recovering well from surgery

## 2011-11-21 LAB — MICROALBUMIN / CREATININE URINE RATIO
Creatinine, Urine: 24.6 mg/dL
Microalb Creat Ratio: 924 mg/g — ABNORMAL HIGH (ref 0.0–30.0)
Microalb, Ur: 22.73 mg/dL — ABNORMAL HIGH (ref 0.00–1.89)

## 2011-11-21 LAB — TESTOSTERONE: Testosterone: 450.68 ng/dL (ref 250–890)

## 2012-01-07 ENCOUNTER — Other Ambulatory Visit: Payer: Self-pay | Admitting: Licensed Clinical Social Worker

## 2012-01-07 DIAGNOSIS — B20 Human immunodeficiency virus [HIV] disease: Secondary | ICD-10-CM

## 2012-02-05 ENCOUNTER — Other Ambulatory Visit: Payer: Medicaid Other

## 2012-02-05 ENCOUNTER — Other Ambulatory Visit (HOSPITAL_COMMUNITY)
Admission: RE | Admit: 2012-02-05 | Discharge: 2012-02-05 | Disposition: A | Discharge status: Home / Self Care | Payer: Medicare Other | Source: Ambulatory Visit | Attending: Infectious Diseases | Admitting: Infectious Diseases

## 2012-02-05 DIAGNOSIS — B20 Human immunodeficiency virus [HIV] disease: Secondary | ICD-10-CM

## 2012-02-05 DIAGNOSIS — Z113 Encounter for screening for infections with a predominantly sexual mode of transmission: Secondary | ICD-10-CM | POA: Insufficient documentation

## 2012-02-05 LAB — CBC WITH DIFFERENTIAL/PLATELET
Basophils Absolute: 0 10*3/uL (ref 0.0–0.1)
Basophils Relative: 1 % (ref 0–1)
Eosinophils Absolute: 0.1 10*3/uL (ref 0.0–0.7)
Hemoglobin: 15.9 g/dL (ref 13.0–17.0)
MCH: 31.2 pg (ref 26.0–34.0)
MCHC: 34.4 g/dL (ref 30.0–36.0)
Monocytes Absolute: 0.6 10*3/uL (ref 0.1–1.0)
Monocytes Relative: 11 % (ref 3–12)
Neutrophils Relative %: 66 % (ref 43–77)
RDW: 13.5 % (ref 11.5–15.5)

## 2012-02-05 LAB — LIPID PANEL
Cholesterol: 236 mg/dL — ABNORMAL HIGH (ref 0–200)
LDL Cholesterol: 111 mg/dL — ABNORMAL HIGH (ref 0–99)
VLDL: 39 mg/dL (ref 0–40)

## 2012-02-06 LAB — COMPLETE METABOLIC PANEL WITH GFR
ALT: 22 U/L (ref 0–53)
Albumin: 5 g/dL (ref 3.5–5.2)
CO2: 21 mEq/L (ref 19–32)
Chloride: 103 mEq/L (ref 96–112)
GFR, Est African American: 53 mL/min — ABNORMAL LOW
Glucose, Bld: 95 mg/dL (ref 70–99)
Potassium: 4.1 mEq/L (ref 3.5–5.3)
Sodium: 137 mEq/L (ref 135–145)
Total Bilirubin: 0.7 mg/dL (ref 0.3–1.2)
Total Protein: 7.6 g/dL (ref 6.0–8.3)

## 2012-02-06 LAB — T-HELPER CELL (CD4) - (RCID CLINIC ONLY)
CD4 % Helper T Cell: 38 % (ref 33–55)
CD4 T Cell Abs: 420 uL (ref 400–2700)

## 2012-02-07 LAB — HIV-1 RNA QUANT-NO REFLEX-BLD: HIV-1 RNA Quant, Log: <1.3 {Log} (ref <1.30)

## 2012-02-19 ENCOUNTER — Ambulatory Visit: Payer: Medicaid Other | Admitting: Infectious Disease

## 2012-02-20 ENCOUNTER — Ambulatory Visit (INDEPENDENT_AMBULATORY_CARE_PROVIDER_SITE_OTHER): Payer: Medicaid Other | Admitting: Infectious Disease

## 2012-02-20 ENCOUNTER — Encounter: Payer: Self-pay | Admitting: Infectious Disease

## 2012-02-20 DIAGNOSIS — E785 Hyperlipidemia, unspecified: Secondary | ICD-10-CM

## 2012-02-20 DIAGNOSIS — N189 Chronic kidney disease, unspecified: Secondary | ICD-10-CM

## 2012-02-20 DIAGNOSIS — J45909 Unspecified asthma, uncomplicated: Secondary | ICD-10-CM

## 2012-02-20 DIAGNOSIS — B20 Human immunodeficiency virus [HIV] disease: Secondary | ICD-10-CM

## 2012-02-20 DIAGNOSIS — I10 Essential (primary) hypertension: Secondary | ICD-10-CM

## 2012-02-20 DIAGNOSIS — M8700 Idiopathic aseptic necrosis of unspecified bone: Secondary | ICD-10-CM | POA: Insufficient documentation

## 2012-02-20 DIAGNOSIS — M25569 Pain in unspecified knee: Secondary | ICD-10-CM

## 2012-02-20 MED ORDER — HYDROCHLOROTHIAZIDE 25 MG PO TABS: 25.0000 mg | ORAL_TABLET | Freq: Every day | ORAL | Status: DC

## 2012-02-20 MED ORDER — BECLOMETHASONE DIPROPIONATE 40 MCG/ACT IN AERS: 2.0000 | INHALATION_SPRAY | Freq: Two times a day (BID) | RESPIRATORY_TRACT | Status: DC

## 2012-02-20 MED ORDER — DILTIAZEM HCL ER 240 MG PO CP24: 240.0000 mg | ORAL_CAPSULE | Freq: Every day | ORAL | Status: DC

## 2012-02-20 MED ORDER — ALBUTEROL SULFATE HFA 108 (90 BASE) MCG/ACT IN AERS: 1.0000 | INHALATION_SPRAY | Freq: Four times a day (QID) | RESPIRATORY_TRACT | Status: DC | PRN

## 2012-02-20 MED ORDER — OXYCODONE HCL 10 MG PO TABS: 10.0000 mg | ORAL_TABLET | Freq: Two times a day (BID) | ORAL | Status: DC | PRN

## 2012-02-20 MED ORDER — ABACAVIR SULFATE-LAMIVUDINE 600-300 MG PO TABS: 1.0000 | ORAL_TABLET | Freq: Every day | ORAL | Status: DC

## 2012-02-20 NOTE — Progress Notes (Signed)
  Subjective:    Patient ID: Christopher Levine, male    DOB: 10-Jun-1956, 56 y.o.   MRN: [id number]  HPI  Sundiata Ferrick is a 56 y.o. male who is doing superbly well on his antiviral regimen, prezista, norvir and truvada  with undetectable viral load and health cd4 count.  His blood pressure remains poorly controlled and he claims to have run out of HCTZ. His pulse is high as well. His creatinine has risen to 1.6 and he has elevated microalbumin and creatinine ratio. I am changing him to from truvada to epzicom. I am escalating his dose of diltiazem. He was unable to fill his viagra due to high copay cost. I spent greater than 45 minutes with the patient including greater than 50% of time in face to face counsel of the patient and in coordination of their care.   Review of Systems  Constitutional: Negative for fever, chills, diaphoresis, activity change, appetite change, fatigue and unexpected weight change.  HENT: Negative for congestion, sore throat, rhinorrhea, sneezing, trouble swallowing and sinus pressure.   Eyes: Negative for photophobia and visual disturbance.  Respiratory: Negative for cough, chest tightness, shortness of breath, wheezing and stridor.   Cardiovascular: Negative for chest pain, palpitations and leg swelling.  Gastrointestinal: Negative for nausea, vomiting, abdominal pain, diarrhea, constipation, blood in stool, abdominal distention and anal bleeding.  Genitourinary: Negative for dysuria, hematuria, flank pain and difficulty urinating.  Musculoskeletal: Negative for myalgias, back pain, joint swelling, arthralgias and gait problem.  Skin: Negative for color change, pallor, rash and wound.  Neurological: Negative for dizziness, tremors, weakness and light-headedness.  Hematological: Negative for adenopathy. Does not bruise/bleed easily.  Psychiatric/Behavioral: Negative for behavioral problems, confusion, sleep disturbance, dysphoric mood, decreased concentration and agitation.         Objective:   Physical Exam  Constitutional: He is oriented to person, place, and time. He appears well-developed and well-nourished. No distress.  HENT:  Head: Normocephalic and atraumatic.  Mouth/Throat: Oropharynx is clear and moist. No oropharyngeal exudate.  Eyes: Conjunctivae and EOM are normal. Pupils are equal, round, and reactive to light. No scleral icterus.  Neck: Normal range of motion. Neck supple. No JVD present.  Cardiovascular: Normal rate, regular rhythm and normal heart sounds.  Exam reveals no gallop and no friction rub.   No murmur heard. Pulmonary/Chest: Effort normal and breath sounds normal. No respiratory distress. He has no wheezes. He has no rales. He exhibits no tenderness.  Abdominal: He exhibits no distension and no mass. There is no tenderness. There is no rebound and no guarding.  Musculoskeletal: He exhibits no edema and no tenderness.  Lymphadenopathy:    He has no cervical adenopathy.  Neurological: He is alert and oriented to person, place, and time. He has normal reflexes. He exhibits normal muscle tone. Coordination normal.  Skin: Skin is warm and dry. He is not diaphoretic. No erythema. No pallor.  Psychiatric: He has a normal mood and affect. His behavior is normal. Judgment and thought content normal.          Assessment & Plan:  HIV DISEASE Change to epzicom with his prezista and norvir  CKD (chronic kidney disease) See above changes. Try to better control his BP  HYPERLIPIDEMIA Continue lipitor  HYPERTENSION Up diltiazem, refill hctz, ask for him to bring his pill bottles to next appt  Knee pain Likely due to osteoarthritis. Will give him oxycodone. NO NSAIDS

## 2012-02-20 NOTE — Assessment & Plan Note (Signed)
See above changes. Try to better control his BP

## 2012-02-20 NOTE — Assessment & Plan Note (Signed)
Likely due to osteoarthritis. Will give him oxycodone. NO NSAIDS

## 2012-02-20 NOTE — Assessment & Plan Note (Addendum)
Change to epzicom with his prezista and norvir

## 2012-02-20 NOTE — Assessment & Plan Note (Signed)
Continue lipitor  ?

## 2012-02-20 NOTE — Patient Instructions (Signed)
i have changed your hiv meds as follows  You will now take  EPZICOM ONE ORANGE TABLET DAILY  STOP THE TRUVADA (BLUE PILL)  CONTINUE PREZISTA 800MG  TABLET (OTHER ORANGE PILL)  AND NORVIR (100MG ) WHITE PILL DAILY

## 2012-02-20 NOTE — Assessment & Plan Note (Signed)
Up diltiazem, refill hctz, ask for him to bring his pill bottles to next appt

## 2012-04-08 ENCOUNTER — Other Ambulatory Visit: Payer: Medicaid Other

## 2012-04-13 ENCOUNTER — Other Ambulatory Visit: Payer: Medicaid Other

## 2012-04-16 ENCOUNTER — Other Ambulatory Visit: Payer: Medicaid Other

## 2012-04-16 DIAGNOSIS — B20 Human immunodeficiency virus [HIV] disease: Secondary | ICD-10-CM

## 2012-04-16 LAB — COMPLETE METABOLIC PANEL WITH GFR
ALT: 17 U/L (ref 0–53)
AST: 16 U/L (ref 0–37)
Alkaline Phosphatase: 54 U/L (ref 39–117)
BUN: 13 mg/dL (ref 6–23)
Calcium: 8.9 mg/dL (ref 8.4–10.5)
Chloride: 107 mEq/L (ref 96–112)
Creat: 1.56 mg/dL — ABNORMAL HIGH (ref 0.50–1.35)

## 2012-04-16 LAB — CBC WITH DIFFERENTIAL/PLATELET
Basophils Relative: 1 % (ref 0–1)
Eosinophils Absolute: 0.1 10*3/uL (ref 0.0–0.7)
Eosinophils Relative: 3 % (ref 0–5)
HCT: 43.2 % (ref 39.0–52.0)
Hemoglobin: 14.9 g/dL (ref 13.0–17.0)
MCH: 31.7 pg (ref 26.0–34.0)
MCHC: 34.5 g/dL (ref 30.0–36.0)
MCV: 91.9 fL (ref 78.0–100.0)
Monocytes Absolute: 0.6 10*3/uL (ref 0.1–1.0)
Monocytes Relative: 12 % (ref 3–12)
RDW: 14.1 % (ref 11.5–15.5)

## 2012-04-17 LAB — HIV-1 RNA QUANT-NO REFLEX-BLD
HIV 1 RNA Quant: <20 copies/mL (ref <20)
HIV-1 RNA Quant, Log: <1.3 {Log} (ref <1.30)

## 2012-04-17 LAB — T-HELPER CELL (CD4) - (RCID CLINIC ONLY): CD4 % Helper T Cell: 38 % (ref 33–55)

## 2012-04-22 ENCOUNTER — Ambulatory Visit: Payer: Medicaid Other | Admitting: Infectious Disease

## 2012-05-04 ENCOUNTER — Other Ambulatory Visit: Payer: Self-pay | Admitting: *Deleted

## 2012-05-04 DIAGNOSIS — B20 Human immunodeficiency virus [HIV] disease: Secondary | ICD-10-CM

## 2012-05-04 MED ORDER — RITONAVIR 100 MG PO CAPS: 100.0000 mg | ORAL_CAPSULE | Freq: Every day | ORAL | Status: DC

## 2012-07-01 ENCOUNTER — Encounter: Payer: Self-pay | Admitting: Infectious Disease

## 2012-07-01 ENCOUNTER — Ambulatory Visit (INDEPENDENT_AMBULATORY_CARE_PROVIDER_SITE_OTHER): Payer: Medicare Other | Admitting: Infectious Disease

## 2012-07-01 VITALS — BP 175/104 | HR 91 | Temp 98.2°F | Ht 71.0 in | Wt 201.0 lb

## 2012-07-01 DIAGNOSIS — M8700 Idiopathic aseptic necrosis of unspecified bone: Secondary | ICD-10-CM

## 2012-07-01 DIAGNOSIS — B20 Human immunodeficiency virus [HIV] disease: Secondary | ICD-10-CM

## 2012-07-01 DIAGNOSIS — N189 Chronic kidney disease, unspecified: Secondary | ICD-10-CM

## 2012-07-01 DIAGNOSIS — I10 Essential (primary) hypertension: Secondary | ICD-10-CM

## 2012-07-01 DIAGNOSIS — Z23 Encounter for immunization: Secondary | ICD-10-CM

## 2012-07-01 DIAGNOSIS — M25569 Pain in unspecified knee: Secondary | ICD-10-CM

## 2012-07-01 DIAGNOSIS — E785 Hyperlipidemia, unspecified: Secondary | ICD-10-CM

## 2012-07-01 DIAGNOSIS — M1990 Unspecified osteoarthritis, unspecified site: Secondary | ICD-10-CM

## 2012-07-01 DIAGNOSIS — Z113 Encounter for screening for infections with a predominantly sexual mode of transmission: Secondary | ICD-10-CM

## 2012-07-01 MED ORDER — OXYCODONE HCL 10 MG PO TABS: 10.0000 mg | ORAL_TABLET | Freq: Two times a day (BID) | ORAL | Status: DC | PRN

## 2012-07-01 NOTE — Assessment & Plan Note (Signed)
We'll give him oxycodone for this. May want to consider getting an MRI in case he does have AVN involving bones in this area.

## 2012-07-01 NOTE — Patient Instructions (Addendum)
We will make fu appt in 2 months with me  PLEASE BRING ALL MEDS WITH YOU TO NEXT APPT  WE WILL ALSO MAKE APP WITH PRIMARY CARE FOR YOU

## 2012-07-01 NOTE — Assessment & Plan Note (Signed)
Recheck microalbumin to creatinine ratio today try to optimize his blood pressure

## 2012-07-01 NOTE — Assessment & Plan Note (Signed)
Needs to take his diltiazem in addition to his hydrochlorothiazide. I've asked him to bring both medicines in fact all of his medicines to his next clinic visit. Also I can be seen by primary care physician.

## 2012-07-01 NOTE — Assessment & Plan Note (Signed)
That is post hip replacement. I wonder if his knee pain could be due to AVN as well although arthritis is more likely.

## 2012-07-01 NOTE — Assessment & Plan Note (Signed)
Excellent job continue current regimen. 

## 2012-07-01 NOTE — Progress Notes (Signed)
Subjective:    Patient ID: Christopher Levine, male    DOB: Apr 21, 1956, 56 y.o.   MRN: [id number]  HPI  Christopher Levine is a 56 y.o. male who is doing superbly well on his antiviral regimen, prezista, norvir and epzicom. His blood pressure however is still not optimally controlled. He is taking hypothyroid claims but not the diltiazem in the last 4 days. He does continue to suffer from right knee pain and has been seeing Dr. Ardine Eng and for this. He states that he has been found to have osteoarthritis involving his knee. 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 Patient otherwise is doing well.   Review of Systems  Constitutional: Negative for fever, chills, diaphoresis, activity change, appetite change, fatigue and unexpected weight change.  HENT: Negative for congestion, sore throat, rhinorrhea, sneezing, trouble swallowing and sinus pressure.   Eyes: Negative for photophobia and visual disturbance.  Respiratory: Negative for cough, chest tightness, shortness of breath, wheezing and stridor.   Cardiovascular: Negative for chest pain, palpitations and leg swelling.  Gastrointestinal: Negative for nausea, vomiting, abdominal pain, diarrhea, constipation, blood in stool, abdominal distention and anal bleeding.  Genitourinary: Negative for dysuria, hematuria, flank pain and difficulty urinating.  Musculoskeletal: Positive for joint swelling, arthralgias and gait problem. Negative for myalgias and back pain.  Skin: Negative for color change, pallor, rash and wound.  Neurological: Negative for dizziness, tremors, weakness and light-headedness.  Hematological: Negative for adenopathy. Does not bruise/bleed easily.  Psychiatric/Behavioral: Negative for behavioral problems, confusion, disturbed wake/sleep cycle, dysphoric mood, decreased concentration and agitation.       Objective:   Physical Exam  Constitutional: He is oriented to person, place, and  time. He appears well-developed and well-nourished. No distress.  HENT:  Head: Normocephalic and atraumatic.  Mouth/Throat: Oropharynx is clear and moist. No oropharyngeal exudate.  Eyes: Conjunctivae normal and EOM are normal. Pupils are equal, round, and reactive to light. No scleral icterus.  Neck: Normal range of motion. Neck supple. No JVD present.  Cardiovascular: Normal rate, regular rhythm and normal heart sounds.  Exam reveals no gallop and no friction rub.   No murmur heard. Pulmonary/Chest: Effort normal and breath sounds normal. No respiratory distress. He has no wheezes. He has no rales. He exhibits no tenderness.  Abdominal: He exhibits no distension and no mass. There is no tenderness. There is no rebound and no guarding.  Musculoskeletal: He exhibits no edema and no tenderness.       Right knee: tenderness found.  Lymphadenopathy:    He has no cervical adenopathy.  Neurological: He is alert and oriented to person, place, and time. He has normal reflexes. He exhibits normal muscle tone. Coordination normal.  Skin: Skin is warm and dry. He is not diaphoretic. No erythema. No pallor.  Psychiatric: He has a normal mood and affect. His behavior is normal. Judgment and thought content normal.          Assessment & Plan:  HIV DISEASE Excellent job continue current regimen  HYPERTENSION Needs to take his diltiazem in addition to his hydrochlorothiazide. I've asked him to bring both medicines in fact all of his medicines to his next clinic visit. Also I can be seen by primary care physician.  CKD (chronic kidney disease) Recheck microalbumin to creatinine ratio today try to optimize his blood pressure  AVN (avascular necrosis of bone) That is post hip replacement. I wonder if his knee pain could be  due to AVN as well although arthritis is more likely.  OSTEOARTHRITIS We'll give him oxycodone for this. May want to consider getting an MRI in case he does have AVN involving  bones in this area.

## 2012-07-02 LAB — CBC WITH DIFFERENTIAL/PLATELET
Basophils Relative: 1 % (ref 0–1)
Eosinophils Absolute: 0.1 10*3/uL (ref 0.0–0.7)
Lymphs Abs: 1 10*3/uL (ref 0.7–4.0)
MCH: 32.5 pg (ref 26.0–34.0)
Neutrophils Relative %: 59 % (ref 43–77)
Platelets: 204 10*3/uL (ref 150–400)
RBC: 4.4 MIL/uL (ref 4.22–5.81)

## 2012-07-02 LAB — COMPLETE METABOLIC PANEL WITH GFR
ALT: 21 U/L (ref 0–53)
BUN: 15 mg/dL (ref 6–23)
CO2: 25 mEq/L (ref 19–32)
Calcium: 8.9 mg/dL (ref 8.4–10.5)
Chloride: 106 mEq/L (ref 96–112)
Creat: 1.42 mg/dL — ABNORMAL HIGH (ref 0.50–1.35)
GFR, Est African American: 65 mL/min
GFR, Est Non African American: 56 mL/min — ABNORMAL LOW
Glucose, Bld: 81 mg/dL (ref 70–99)

## 2012-07-02 LAB — LIPID PANEL
HDL: 84 mg/dL (ref >39)
LDL Cholesterol: 110 mg/dL — ABNORMAL HIGH (ref 0–99)
Total CHOL/HDL Ratio: 2.8 Ratio

## 2012-07-05 LAB — HIV-1 RNA QUANT-NO REFLEX-BLD: HIV 1 RNA Quant: 24 copies/mL — ABNORMAL HIGH (ref <20)

## 2012-07-20 ENCOUNTER — Other Ambulatory Visit: Payer: Self-pay | Admitting: Licensed Clinical Social Worker

## 2012-07-20 DIAGNOSIS — I10 Essential (primary) hypertension: Secondary | ICD-10-CM

## 2012-07-20 MED ORDER — HYDROCHLOROTHIAZIDE 25 MG PO TABS: 25.0000 mg | ORAL_TABLET | Freq: Every day | ORAL | Status: DC

## 2012-07-27 ENCOUNTER — Other Ambulatory Visit: Payer: Self-pay | Admitting: Licensed Clinical Social Worker

## 2012-07-27 DIAGNOSIS — Z23 Encounter for immunization: Secondary | ICD-10-CM

## 2012-07-27 DIAGNOSIS — M8700 Idiopathic aseptic necrosis of unspecified bone: Secondary | ICD-10-CM

## 2012-07-27 DIAGNOSIS — N189 Chronic kidney disease, unspecified: Secondary | ICD-10-CM

## 2012-07-27 DIAGNOSIS — I10 Essential (primary) hypertension: Secondary | ICD-10-CM

## 2012-07-27 DIAGNOSIS — B20 Human immunodeficiency virus [HIV] disease: Secondary | ICD-10-CM

## 2012-07-27 DIAGNOSIS — Z113 Encounter for screening for infections with a predominantly sexual mode of transmission: Secondary | ICD-10-CM

## 2012-07-27 DIAGNOSIS — M1990 Unspecified osteoarthritis, unspecified site: Secondary | ICD-10-CM

## 2012-07-27 DIAGNOSIS — E785 Hyperlipidemia, unspecified: Secondary | ICD-10-CM

## 2012-07-27 DIAGNOSIS — M25569 Pain in unspecified knee: Secondary | ICD-10-CM

## 2012-07-27 MED ORDER — OXYCODONE HCL 10 MG PO TABS: 10.0000 mg | ORAL_TABLET | Freq: Two times a day (BID) | ORAL | Status: DC | PRN

## 2012-08-31 ENCOUNTER — Encounter: Payer: Self-pay | Admitting: Infectious Disease

## 2012-08-31 ENCOUNTER — Ambulatory Visit (INDEPENDENT_AMBULATORY_CARE_PROVIDER_SITE_OTHER): Payer: Medicare Other | Admitting: Infectious Disease

## 2012-08-31 VITALS — BP 148/104 | HR 80 | Temp 97.9°F | Wt 201.0 lb

## 2012-08-31 DIAGNOSIS — E785 Hyperlipidemia, unspecified: Secondary | ICD-10-CM

## 2012-08-31 DIAGNOSIS — Z23 Encounter for immunization: Secondary | ICD-10-CM

## 2012-08-31 DIAGNOSIS — J45909 Unspecified asthma, uncomplicated: Secondary | ICD-10-CM

## 2012-08-31 DIAGNOSIS — N189 Chronic kidney disease, unspecified: Secondary | ICD-10-CM

## 2012-08-31 DIAGNOSIS — M25569 Pain in unspecified knee: Secondary | ICD-10-CM

## 2012-08-31 DIAGNOSIS — M8700 Idiopathic aseptic necrosis of unspecified bone: Secondary | ICD-10-CM

## 2012-08-31 DIAGNOSIS — M1990 Unspecified osteoarthritis, unspecified site: Secondary | ICD-10-CM

## 2012-08-31 DIAGNOSIS — I10 Essential (primary) hypertension: Secondary | ICD-10-CM

## 2012-08-31 DIAGNOSIS — B20 Human immunodeficiency virus [HIV] disease: Secondary | ICD-10-CM

## 2012-08-31 DIAGNOSIS — Z113 Encounter for screening for infections with a predominantly sexual mode of transmission: Secondary | ICD-10-CM

## 2012-08-31 MED ORDER — OXYCODONE HCL 10 MG PO TABS: 10.0000 mg | ORAL_TABLET | Freq: Two times a day (BID) | ORAL | Status: DC | PRN

## 2012-08-31 MED ORDER — ABACAVIR SULFATE-LAMIVUDINE 600-300 MG PO TABS: 1.0000 | ORAL_TABLET | Freq: Every day | ORAL | Status: DC

## 2012-08-31 MED ORDER — ALBUTEROL SULFATE HFA 108 (90 BASE) MCG/ACT IN AERS: 1.0000 | INHALATION_SPRAY | Freq: Four times a day (QID) | RESPIRATORY_TRACT | Status: DC | PRN

## 2012-08-31 MED ORDER — HYDROCHLOROTHIAZIDE 25 MG PO TABS: 25.0000 mg | ORAL_TABLET | Freq: Every day | ORAL | Status: DC

## 2012-08-31 MED ORDER — BECLOMETHASONE DIPROPIONATE 40 MCG/ACT IN AERS: 2.0000 | INHALATION_SPRAY | Freq: Two times a day (BID) | RESPIRATORY_TRACT | Status: DC

## 2012-08-31 MED ORDER — DILTIAZEM HCL ER 240 MG PO CP24: 240.0000 mg | ORAL_CAPSULE | Freq: Every day | ORAL | Status: DC

## 2012-08-31 MED ORDER — ATORVASTATIN CALCIUM 20 MG PO TABS: 20.0000 mg | ORAL_TABLET | Freq: Every day | ORAL | Status: DC

## 2012-08-31 MED ORDER — DARUNAVIR ETHANOLATE 800 MG PO TABS: 800.0000 mg | ORAL_TABLET | Freq: Every day | ORAL | Status: DC

## 2012-08-31 NOTE — Progress Notes (Signed)
  Subjective:    Patient ID: Christopher Levine, male    DOB: 1956/10/19, 56 y.o.   MRN: [id number]  HPI  56 y.o. male who is doing superbly well on his antiviral regimen, prezista, norvir and epzicom. His blood pressure however is still not optimally controlled though he claims to be taking  His HCTZ and diltiazem. I am referring him to PCP MD    Review of Systems  Constitutional: Negative for fever, chills, diaphoresis, activity change, appetite change, fatigue and unexpected weight change.  HENT: Negative for congestion, sore throat, rhinorrhea, sneezing, trouble swallowing and sinus pressure.   Eyes: Negative for photophobia and visual disturbance.  Respiratory: Negative for cough, chest tightness, shortness of breath, wheezing and stridor.   Cardiovascular: Negative for chest pain, palpitations and leg swelling.  Gastrointestinal: Negative for nausea, vomiting, abdominal pain, diarrhea, constipation, blood in stool, abdominal distention and anal bleeding.  Genitourinary: Negative for dysuria, hematuria, flank pain and difficulty urinating.  Musculoskeletal: Negative for myalgias, back pain, joint swelling, arthralgias and gait problem.  Skin: Negative for color change, pallor, rash and wound.  Neurological: Negative for dizziness, tremors, weakness and light-headedness.  Hematological: Negative for adenopathy. Does not bruise/bleed easily.  Psychiatric/Behavioral: Negative for behavioral problems, confusion, sleep disturbance, dysphoric mood, decreased concentration and agitation.       Objective:   Physical Exam  Constitutional: He is oriented to person, place, and time. He appears well-developed and well-nourished. No distress.  HENT:  Head: Normocephalic and atraumatic.  Mouth/Throat: Oropharynx is clear and moist. No oropharyngeal exudate.  Eyes: Conjunctivae normal and EOM are normal. Pupils are equal, round, and reactive to light. No scleral icterus.  Neck: Normal range of  motion. Neck supple. No JVD present.  Cardiovascular: Normal rate, regular rhythm and normal heart sounds.  Exam reveals no gallop and no friction rub.   No murmur heard. Pulmonary/Chest: Effort normal and breath sounds normal. No respiratory distress. He has no wheezes. He has no rales. He exhibits no tenderness.  Abdominal: He exhibits no distension and no mass. There is no tenderness. There is no rebound and no guarding.  Musculoskeletal: He exhibits no edema and no tenderness.  Lymphadenopathy:    He has no cervical adenopathy.  Neurological: He is alert and oriented to person, place, and time. He has normal reflexes. He exhibits normal muscle tone. Coordination normal.  Skin: Skin is warm and dry. He is not diaphoretic. No erythema. No pallor.  Psychiatric: He has a normal mood and affect. His behavior is normal. Judgment and thought content normal.          Assessment & Plan:  HIV DISEASE  Continue  epzicom with his prezista and norvir   CKD  Trying to optimize this BP, referring to PCP   HYPERLIPIDEMIA  Continue lipitor   HYPERTENSION  Continue diltiazem, refill hctz, ask for him to bring his pill bottles to next appt refer to PCP  Asthma:   Refilled inhalers

## 2012-09-28 ENCOUNTER — Telehealth: Payer: Self-pay | Admitting: *Deleted

## 2012-09-28 NOTE — Telephone Encounter (Signed)
He can have refill of it. Is he signed with pain contract?

## 2012-09-28 NOTE — Telephone Encounter (Signed)
Patient called requesting refill on oxycodone, he is also seeing an orthopedic per note from September, please advise. Wendall Mola CMA

## 2012-09-29 ENCOUNTER — Other Ambulatory Visit: Payer: Self-pay | Admitting: *Deleted

## 2012-09-29 DIAGNOSIS — Z23 Encounter for immunization: Secondary | ICD-10-CM

## 2012-09-29 DIAGNOSIS — E785 Hyperlipidemia, unspecified: Secondary | ICD-10-CM

## 2012-09-29 DIAGNOSIS — B20 Human immunodeficiency virus [HIV] disease: Secondary | ICD-10-CM

## 2012-09-29 DIAGNOSIS — N189 Chronic kidney disease, unspecified: Secondary | ICD-10-CM

## 2012-09-29 DIAGNOSIS — I10 Essential (primary) hypertension: Secondary | ICD-10-CM

## 2012-09-29 DIAGNOSIS — Z113 Encounter for screening for infections with a predominantly sexual mode of transmission: Secondary | ICD-10-CM

## 2012-09-29 DIAGNOSIS — M25569 Pain in unspecified knee: Secondary | ICD-10-CM

## 2012-09-29 DIAGNOSIS — M8700 Idiopathic aseptic necrosis of unspecified bone: Secondary | ICD-10-CM

## 2012-09-29 DIAGNOSIS — M1990 Unspecified osteoarthritis, unspecified site: Secondary | ICD-10-CM

## 2012-09-29 MED ORDER — OXYCODONE HCL 10 MG PO TABS: 10.0000 mg | ORAL_TABLET | Freq: Two times a day (BID) | ORAL | Status: DC | PRN

## 2012-09-29 NOTE — Telephone Encounter (Signed)
Thank you, and he does have a pain contract

## 2012-10-27 ENCOUNTER — Other Ambulatory Visit: Payer: Self-pay | Admitting: *Deleted

## 2012-10-27 DIAGNOSIS — M25569 Pain in unspecified knee: Secondary | ICD-10-CM

## 2012-10-27 DIAGNOSIS — Z113 Encounter for screening for infections with a predominantly sexual mode of transmission: Secondary | ICD-10-CM

## 2012-10-27 DIAGNOSIS — Z23 Encounter for immunization: Secondary | ICD-10-CM

## 2012-10-27 DIAGNOSIS — I10 Essential (primary) hypertension: Secondary | ICD-10-CM

## 2012-10-27 DIAGNOSIS — N189 Chronic kidney disease, unspecified: Secondary | ICD-10-CM

## 2012-10-27 DIAGNOSIS — M1990 Unspecified osteoarthritis, unspecified site: Secondary | ICD-10-CM

## 2012-10-27 DIAGNOSIS — B20 Human immunodeficiency virus [HIV] disease: Secondary | ICD-10-CM

## 2012-10-27 DIAGNOSIS — E785 Hyperlipidemia, unspecified: Secondary | ICD-10-CM

## 2012-10-27 DIAGNOSIS — M8700 Idiopathic aseptic necrosis of unspecified bone: Secondary | ICD-10-CM

## 2012-10-27 MED ORDER — OXYCODONE HCL 10 MG PO TABS: 10.0000 mg | ORAL_TABLET | Freq: Two times a day (BID) | ORAL | Status: DC | PRN

## 2012-10-27 MED ORDER — DILTIAZEM HCL ER 240 MG PO CP24: 240.0000 mg | ORAL_CAPSULE | Freq: Every day | ORAL | Status: DC

## 2012-11-30 ENCOUNTER — Other Ambulatory Visit: Payer: Self-pay | Admitting: *Deleted

## 2012-11-30 DIAGNOSIS — M25569 Pain in unspecified knee: Secondary | ICD-10-CM

## 2012-11-30 DIAGNOSIS — I10 Essential (primary) hypertension: Secondary | ICD-10-CM

## 2012-11-30 DIAGNOSIS — E785 Hyperlipidemia, unspecified: Secondary | ICD-10-CM

## 2012-11-30 DIAGNOSIS — Z23 Encounter for immunization: Secondary | ICD-10-CM

## 2012-11-30 DIAGNOSIS — M1990 Unspecified osteoarthritis, unspecified site: Secondary | ICD-10-CM

## 2012-11-30 DIAGNOSIS — B20 Human immunodeficiency virus [HIV] disease: Secondary | ICD-10-CM

## 2012-11-30 DIAGNOSIS — N189 Chronic kidney disease, unspecified: Secondary | ICD-10-CM

## 2012-11-30 DIAGNOSIS — M8700 Idiopathic aseptic necrosis of unspecified bone: Secondary | ICD-10-CM

## 2012-11-30 DIAGNOSIS — Z113 Encounter for screening for infections with a predominantly sexual mode of transmission: Secondary | ICD-10-CM

## 2012-11-30 MED ORDER — DILTIAZEM HCL ER 240 MG PO CP24: 240.0000 mg | ORAL_CAPSULE | Freq: Every day | ORAL | Status: DC

## 2012-11-30 MED ORDER — OXYCODONE HCL 10 MG PO TABS: 10.0000 mg | ORAL_TABLET | Freq: Two times a day (BID) | ORAL | Status: DC | PRN

## 2012-11-30 MED ORDER — HYDROCHLOROTHIAZIDE 25 MG PO TABS: 25.0000 mg | ORAL_TABLET | Freq: Every day | ORAL | Status: DC

## 2012-11-30 NOTE — Telephone Encounter (Signed)
Patient called and needed this Rx sent to the pharmacy.

## 2012-12-30 ENCOUNTER — Telehealth: Payer: Self-pay | Admitting: *Deleted

## 2012-12-30 NOTE — Telephone Encounter (Signed)
Patient called asking when he could refill his Oxycodone. RN advised him it could be picked up 01/04/13.  Patient verbalized agreement. Andree Coss, RN

## 2013-01-04 ENCOUNTER — Other Ambulatory Visit: Payer: Self-pay | Admitting: *Deleted

## 2013-01-04 DIAGNOSIS — E785 Hyperlipidemia, unspecified: Secondary | ICD-10-CM

## 2013-01-04 DIAGNOSIS — N189 Chronic kidney disease, unspecified: Secondary | ICD-10-CM

## 2013-01-04 MED ORDER — OXYCODONE HCL 10 MG PO TABS: 10.0000 mg | ORAL_TABLET | Freq: Two times a day (BID) | ORAL | Status: DC | PRN

## 2013-02-03 ENCOUNTER — Other Ambulatory Visit: Payer: Self-pay | Admitting: *Deleted

## 2013-02-03 DIAGNOSIS — I10 Essential (primary) hypertension: Secondary | ICD-10-CM

## 2013-02-03 DIAGNOSIS — Z113 Encounter for screening for infections with a predominantly sexual mode of transmission: Secondary | ICD-10-CM

## 2013-02-03 DIAGNOSIS — E785 Hyperlipidemia, unspecified: Secondary | ICD-10-CM

## 2013-02-03 DIAGNOSIS — M8700 Idiopathic aseptic necrosis of unspecified bone: Secondary | ICD-10-CM

## 2013-02-03 DIAGNOSIS — M1990 Unspecified osteoarthritis, unspecified site: Secondary | ICD-10-CM

## 2013-02-03 DIAGNOSIS — Z23 Encounter for immunization: Secondary | ICD-10-CM

## 2013-02-03 DIAGNOSIS — B20 Human immunodeficiency virus [HIV] disease: Secondary | ICD-10-CM

## 2013-02-03 DIAGNOSIS — N189 Chronic kidney disease, unspecified: Secondary | ICD-10-CM

## 2013-02-03 MED ORDER — OXYCODONE HCL 10 MG PO TABS: 10.0000 mg | ORAL_TABLET | Freq: Two times a day (BID) | ORAL | Status: DC | PRN

## 2013-02-15 ENCOUNTER — Other Ambulatory Visit: Payer: Medicare Other

## 2013-02-17 ENCOUNTER — Other Ambulatory Visit: Payer: Self-pay | Admitting: *Deleted

## 2013-02-17 ENCOUNTER — Other Ambulatory Visit: Payer: Medicare Other

## 2013-02-17 DIAGNOSIS — B20 Human immunodeficiency virus [HIV] disease: Secondary | ICD-10-CM

## 2013-02-17 MED ORDER — RITONAVIR 100 MG PO CAPS: 100.0000 mg | ORAL_CAPSULE | Freq: Every day | ORAL | Status: DC

## 2013-02-25 ENCOUNTER — Other Ambulatory Visit: Payer: Medicare Other

## 2013-02-26 ENCOUNTER — Other Ambulatory Visit (INDEPENDENT_AMBULATORY_CARE_PROVIDER_SITE_OTHER): Payer: Medicare Other

## 2013-02-26 DIAGNOSIS — Z113 Encounter for screening for infections with a predominantly sexual mode of transmission: Secondary | ICD-10-CM

## 2013-02-26 DIAGNOSIS — B20 Human immunodeficiency virus [HIV] disease: Secondary | ICD-10-CM

## 2013-02-26 DIAGNOSIS — E785 Hyperlipidemia, unspecified: Secondary | ICD-10-CM

## 2013-02-26 LAB — CBC WITH DIFFERENTIAL/PLATELET
Hemoglobin: 14.7 g/dL (ref 13.0–17.0)
Lymphocytes Relative: 21 % (ref 12–46)
Lymphs Abs: 1.2 10*3/uL (ref 0.7–4.0)
Monocytes Relative: 11 % (ref 3–12)
Neutro Abs: 3.9 10*3/uL (ref 1.7–7.7)
Neutrophils Relative %: 66 % (ref 43–77)
Platelets: 243 10*3/uL (ref 150–400)
RBC: 4.69 MIL/uL (ref 4.22–5.81)
WBC: 5.9 10*3/uL (ref 4.0–10.5)

## 2013-02-26 LAB — T-HELPER CELL (CD4) - (RCID CLINIC ONLY)
CD4 % Helper T Cell: 40 % (ref 33–55)
CD4 T Cell Abs: 520 uL (ref 400–2700)

## 2013-02-26 LAB — LIPID PANEL
Total CHOL/HDL Ratio: 3.2 Ratio
VLDL: 25 mg/dL (ref 0–40)

## 2013-02-27 LAB — COMPLETE METABOLIC PANEL WITH GFR
Alkaline Phosphatase: 63 U/L (ref 39–117)
BUN: 19 mg/dL (ref 6–23)
Creat: 1.4 mg/dL — ABNORMAL HIGH (ref 0.50–1.35)
GFR, Est Non African American: 57 mL/min — ABNORMAL LOW
Glucose, Bld: 91 mg/dL (ref 70–99)
Total Bilirubin: 0.7 mg/dL (ref 0.3–1.2)

## 2013-02-27 LAB — RPR

## 2013-03-01 ENCOUNTER — Ambulatory Visit: Payer: Medicare Other | Admitting: Infectious Disease

## 2013-03-03 ENCOUNTER — Other Ambulatory Visit: Payer: Self-pay | Admitting: *Deleted

## 2013-03-03 DIAGNOSIS — E785 Hyperlipidemia, unspecified: Secondary | ICD-10-CM

## 2013-03-03 DIAGNOSIS — I10 Essential (primary) hypertension: Secondary | ICD-10-CM

## 2013-03-03 DIAGNOSIS — M1990 Unspecified osteoarthritis, unspecified site: Secondary | ICD-10-CM

## 2013-03-03 DIAGNOSIS — Z23 Encounter for immunization: Secondary | ICD-10-CM

## 2013-03-03 DIAGNOSIS — B20 Human immunodeficiency virus [HIV] disease: Secondary | ICD-10-CM

## 2013-03-03 DIAGNOSIS — M8700 Idiopathic aseptic necrosis of unspecified bone: Secondary | ICD-10-CM

## 2013-03-03 DIAGNOSIS — Z113 Encounter for screening for infections with a predominantly sexual mode of transmission: Secondary | ICD-10-CM

## 2013-03-03 DIAGNOSIS — N189 Chronic kidney disease, unspecified: Secondary | ICD-10-CM

## 2013-03-03 MED ORDER — OXYCODONE HCL 10 MG PO TABS: 10.0000 mg | ORAL_TABLET | Freq: Two times a day (BID) | ORAL | Status: DC | PRN

## 2013-03-04 ENCOUNTER — Encounter: Payer: Self-pay | Admitting: *Deleted

## 2013-03-04 NOTE — Progress Notes (Signed)
Patient ID: Christopher Levine, male   DOB: [birth year], 57 y.o.   MRN: [id number]  Pt here to pick up his oxycodone. ID was verified and he signed pain contract. Tacey Heap RN

## 2013-03-10 ENCOUNTER — Ambulatory Visit (INDEPENDENT_AMBULATORY_CARE_PROVIDER_SITE_OTHER): Payer: Medicare Other | Admitting: Infectious Disease

## 2013-03-10 ENCOUNTER — Encounter: Payer: Self-pay | Admitting: Infectious Disease

## 2013-03-10 VITALS — BP 192/127 | HR 92 | Temp 98.3°F | Wt 205.0 lb

## 2013-03-10 DIAGNOSIS — N182 Chronic kidney disease, stage 2 (mild): Secondary | ICD-10-CM

## 2013-03-10 DIAGNOSIS — J45909 Unspecified asthma, uncomplicated: Secondary | ICD-10-CM

## 2013-03-10 DIAGNOSIS — I10 Essential (primary) hypertension: Secondary | ICD-10-CM

## 2013-03-10 DIAGNOSIS — B20 Human immunodeficiency virus [HIV] disease: Secondary | ICD-10-CM

## 2013-03-10 MED ORDER — LISINOPRIL 10 MG PO TABS: 10.0000 mg | ORAL_TABLET | Freq: Every day | ORAL | Status: DC

## 2013-03-10 MED ORDER — BECLOMETHASONE DIPROPIONATE 40 MCG/ACT IN AERS: 3.0000 | INHALATION_SPRAY | Freq: Two times a day (BID) | RESPIRATORY_TRACT | Status: DC

## 2013-03-10 MED ORDER — ALBUTEROL SULFATE HFA 108 (90 BASE) MCG/ACT IN AERS: 1.0000 | INHALATION_SPRAY | Freq: Four times a day (QID) | RESPIRATORY_TRACT | Status: DC | PRN

## 2013-03-10 NOTE — Progress Notes (Signed)
Subjective:    Patient ID: Christopher Levine, male    DOB: July 28, 1956, 57 y.o.   MRN: [id number]  HPI  57 y.o. male who is doing superbly well on his antiviral regimen, prezista, norvir and epzicom. His blood pressure however is still not optimally controlled though he claims to be taking  His HCTZ and diltiazem. I tried to refer him to a primary care physician but he does not seem to been seen by one yet. He claims that they can both is on chlorothiazide and is 240 mg of diltiazem today although his pulse is 90. We discussed addition of an ACE inhibitor to his medical regimen to see if improves his blood pressure. He is also having some wheezing as well which he thinks might be do to asthma or due to his elevated blood pressure. His blood pressure today in clinic was in the 190s over 120s.  I refilled his albuterol metered-dose inhaler as well as his inhaled corticosteroid.  He'll come back next week for repeat blood pressure check and to bring all his medicines with him.   Review of Systems  Constitutional: Negative for fever, chills, diaphoresis, activity change, appetite change, fatigue and unexpected weight change.  HENT: Negative for congestion, sore throat, rhinorrhea, sneezing, trouble swallowing and sinus pressure.   Eyes: Negative for photophobia and visual disturbance.  Respiratory: Negative for cough, chest tightness, shortness of breath, wheezing and stridor.   Cardiovascular: Negative for chest pain, palpitations and leg swelling.  Gastrointestinal: Negative for nausea, vomiting, abdominal pain, diarrhea, constipation, blood in stool, abdominal distention and anal bleeding.  Genitourinary: Negative for dysuria, hematuria, flank pain and difficulty urinating.  Musculoskeletal: Negative for myalgias, back pain, joint swelling, arthralgias and gait problem.  Skin: Negative for color change, pallor, rash and wound.  Neurological: Negative for dizziness, tremors, weakness and  light-headedness.  Hematological: Negative for adenopathy. Does not bruise/bleed easily.  Psychiatric/Behavioral: Negative for behavioral problems, confusion, sleep disturbance, dysphoric mood, decreased concentration and agitation.       Objective:   Physical Exam  Constitutional: He is oriented to person, place, and time. He appears well-developed and well-nourished. No distress.  HENT:  Head: Normocephalic and atraumatic.  Mouth/Throat: Oropharynx is clear and moist. No oropharyngeal exudate.  Eyes: Conjunctivae and EOM are normal. Pupils are equal, round, and reactive to light. No scleral icterus.  Neck: Normal range of motion. Neck supple. No JVD present.  Cardiovascular: Normal rate, regular rhythm and normal heart sounds.  Exam reveals no gallop and no friction rub.   No murmur heard. Pulmonary/Chest: Effort normal and breath sounds normal. No respiratory distress. He has no wheezes. He has no rales. He exhibits no tenderness.  Abdominal: He exhibits no distension and no mass. There is no tenderness. There is no rebound and no guarding.  Musculoskeletal: He exhibits no edema and no tenderness.  Lymphadenopathy:    He has no cervical adenopathy.  Neurological: He is alert and oriented to person, place, and time. He has normal reflexes. He exhibits normal muscle tone. Coordination normal.  Skin: Skin is warm and dry. He is not diaphoretic. No erythema. No pallor.  Psychiatric: He has a normal mood and affect. His behavior is normal. Judgment and thought content normal.          Assessment & Plan:  HIV DISEASE  Continue  epzicom with his prezista and norvir   CKD  Trying to optimize this BP,   HYPERLIPIDEMIA  Continue lipitor   HYPERTENSION  Continue diltiazem,  refill hctz, add ACEI bring meds and recheck blood pressure and metabolic panel next week.  Asthma:   Refilled inhalers, including corticosteroid 1.

## 2013-03-10 NOTE — Patient Instructions (Addendum)
Start new bp medicine to continue with all of your old bp meds  New med is lisinopril  I have re-written rx for your albuterol inhaler and your QVAR steroid inhaler  RTC on Tuesday so see RN for BP check and labs and BRING all meds with you

## 2013-03-12 ENCOUNTER — Other Ambulatory Visit: Payer: Self-pay | Admitting: *Deleted

## 2013-03-12 DIAGNOSIS — B20 Human immunodeficiency virus [HIV] disease: Secondary | ICD-10-CM

## 2013-03-12 DIAGNOSIS — I10 Essential (primary) hypertension: Secondary | ICD-10-CM

## 2013-03-12 MED ORDER — DILTIAZEM HCL ER 240 MG PO CP24: 240.0000 mg | ORAL_CAPSULE | Freq: Every day | ORAL | Status: DC

## 2013-03-12 MED ORDER — RITONAVIR 100 MG PO CAPS: 100.0000 mg | ORAL_CAPSULE | Freq: Every day | ORAL | Status: DC

## 2013-03-16 ENCOUNTER — Other Ambulatory Visit: Payer: Medicare Other

## 2013-03-24 ENCOUNTER — Other Ambulatory Visit: Payer: Self-pay | Admitting: *Deleted

## 2013-03-24 DIAGNOSIS — J45909 Unspecified asthma, uncomplicated: Secondary | ICD-10-CM

## 2013-03-24 MED ORDER — FLUNISOLIDE 25 MCG/ACT (0.025%) NA SOLN: 2.0000 | Freq: Two times a day (BID) | NASAL | Status: DC

## 2013-03-24 NOTE — Telephone Encounter (Signed)
Per Dr Daiva Eves D/C patient QVAR as his insuranc

## 2013-03-31 ENCOUNTER — Other Ambulatory Visit: Payer: Self-pay | Admitting: Licensed Clinical Social Worker

## 2013-03-31 DIAGNOSIS — M1990 Unspecified osteoarthritis, unspecified site: Secondary | ICD-10-CM

## 2013-03-31 DIAGNOSIS — B20 Human immunodeficiency virus [HIV] disease: Secondary | ICD-10-CM

## 2013-03-31 DIAGNOSIS — E785 Hyperlipidemia, unspecified: Secondary | ICD-10-CM

## 2013-03-31 DIAGNOSIS — Z23 Encounter for immunization: Secondary | ICD-10-CM

## 2013-03-31 DIAGNOSIS — J45909 Unspecified asthma, uncomplicated: Secondary | ICD-10-CM

## 2013-03-31 DIAGNOSIS — M8700 Idiopathic aseptic necrosis of unspecified bone: Secondary | ICD-10-CM

## 2013-03-31 DIAGNOSIS — I10 Essential (primary) hypertension: Secondary | ICD-10-CM

## 2013-03-31 DIAGNOSIS — Z113 Encounter for screening for infections with a predominantly sexual mode of transmission: Secondary | ICD-10-CM

## 2013-03-31 MED ORDER — OXYCODONE HCL 10 MG PO TABS: 10.0000 mg | ORAL_TABLET | Freq: Two times a day (BID) | ORAL | Status: DC | PRN

## 2013-03-31 MED ORDER — HYDROCHLOROTHIAZIDE 25 MG PO TABS: 25.0000 mg | ORAL_TABLET | Freq: Every day | ORAL | Status: DC

## 2013-03-31 MED ORDER — FLUNISOLIDE 25 MCG/ACT (0.025%) NA SOLN: 2.0000 | Freq: Two times a day (BID) | NASAL | Status: AC

## 2013-03-31 MED ORDER — LISINOPRIL 10 MG PO TABS: 10.0000 mg | ORAL_TABLET | Freq: Every day | ORAL | Status: DC

## 2013-03-31 MED ORDER — ALBUTEROL SULFATE HFA 108 (90 BASE) MCG/ACT IN AERS: 1.0000 | INHALATION_SPRAY | Freq: Four times a day (QID) | RESPIRATORY_TRACT | Status: DC | PRN

## 2013-04-02 ENCOUNTER — Other Ambulatory Visit: Payer: Self-pay | Admitting: Licensed Clinical Social Worker

## 2013-04-02 DIAGNOSIS — M1990 Unspecified osteoarthritis, unspecified site: Secondary | ICD-10-CM

## 2013-04-02 DIAGNOSIS — Z113 Encounter for screening for infections with a predominantly sexual mode of transmission: Secondary | ICD-10-CM

## 2013-04-02 DIAGNOSIS — B20 Human immunodeficiency virus [HIV] disease: Secondary | ICD-10-CM

## 2013-04-02 DIAGNOSIS — E785 Hyperlipidemia, unspecified: Secondary | ICD-10-CM

## 2013-04-02 MED ORDER — OXYCODONE HCL 10 MG PO TABS: 10.0000 mg | ORAL_TABLET | Freq: Two times a day (BID) | ORAL | Status: DC | PRN

## 2013-04-08 ENCOUNTER — Telehealth: Payer: Self-pay | Admitting: *Deleted

## 2013-04-08 NOTE — Telephone Encounter (Signed)
Per pharmacy, patient picked up his Flunisolide inhaler on 04/02/13.  This was a substitute for the QVAR which had been denied by his insurance. Andree Coss, RN

## 2013-04-28 ENCOUNTER — Other Ambulatory Visit: Payer: Self-pay | Admitting: Licensed Clinical Social Worker

## 2013-04-28 DIAGNOSIS — M1990 Unspecified osteoarthritis, unspecified site: Secondary | ICD-10-CM

## 2013-04-28 DIAGNOSIS — Z113 Encounter for screening for infections with a predominantly sexual mode of transmission: Secondary | ICD-10-CM

## 2013-04-28 DIAGNOSIS — E785 Hyperlipidemia, unspecified: Secondary | ICD-10-CM

## 2013-04-28 DIAGNOSIS — J45909 Unspecified asthma, uncomplicated: Secondary | ICD-10-CM

## 2013-04-28 DIAGNOSIS — B20 Human immunodeficiency virus [HIV] disease: Secondary | ICD-10-CM

## 2013-04-28 MED ORDER — OXYCODONE HCL 10 MG PO TABS: 10.0000 mg | ORAL_TABLET | Freq: Two times a day (BID) | ORAL | Status: DC | PRN

## 2013-04-28 MED ORDER — ALBUTEROL SULFATE HFA 108 (90 BASE) MCG/ACT IN AERS: 1.0000 | INHALATION_SPRAY | Freq: Four times a day (QID) | RESPIRATORY_TRACT | Status: DC | PRN

## 2013-05-27 ENCOUNTER — Telehealth: Payer: Self-pay | Admitting: *Deleted

## 2013-05-27 NOTE — Telephone Encounter (Signed)
Patient called requesting refill of his oxycodone, due 05/31/13. Reminder to print on Monday for Dr. Ninetta Lights to sign, as Dr. Daiva Eves is out until Wednesday. Wendall Mola

## 2013-05-31 ENCOUNTER — Other Ambulatory Visit (INDEPENDENT_AMBULATORY_CARE_PROVIDER_SITE_OTHER): Payer: Medicare Other

## 2013-05-31 ENCOUNTER — Other Ambulatory Visit: Payer: Self-pay | Admitting: Infectious Disease

## 2013-05-31 ENCOUNTER — Other Ambulatory Visit: Payer: Self-pay | Admitting: *Deleted

## 2013-05-31 DIAGNOSIS — B20 Human immunodeficiency virus [HIV] disease: Secondary | ICD-10-CM

## 2013-05-31 DIAGNOSIS — Z113 Encounter for screening for infections with a predominantly sexual mode of transmission: Secondary | ICD-10-CM

## 2013-05-31 DIAGNOSIS — E785 Hyperlipidemia, unspecified: Secondary | ICD-10-CM

## 2013-05-31 DIAGNOSIS — M1990 Unspecified osteoarthritis, unspecified site: Secondary | ICD-10-CM

## 2013-05-31 LAB — COMPREHENSIVE METABOLIC PANEL
Albumin: 4.6 g/dL (ref 3.5–5.2)
Alkaline Phosphatase: 63 U/L (ref 39–117)
BUN: 19 mg/dL (ref 6–23)
Calcium: 9.6 mg/dL (ref 8.4–10.5)
Creat: 1.44 mg/dL — ABNORMAL HIGH (ref 0.50–1.35)
Glucose, Bld: 64 mg/dL — ABNORMAL LOW (ref 70–99)
Potassium: 4.2 mEq/L (ref 3.5–5.3)

## 2013-05-31 LAB — CBC WITH DIFFERENTIAL/PLATELET
Basophils Relative: 0 % (ref 0–1)
Eosinophils Relative: 2 % (ref 0–5)
HCT: 43.9 % (ref 39.0–52.0)
Hemoglobin: 15.1 g/dL (ref 13.0–17.0)
MCH: 31.7 pg (ref 26.0–34.0)
MCHC: 34.4 g/dL (ref 30.0–36.0)
MCV: 92.2 fL (ref 78.0–100.0)
Monocytes Absolute: 0.7 10*3/uL (ref 0.1–1.0)
Monocytes Relative: 14 % — ABNORMAL HIGH (ref 3–12)
Neutro Abs: 3.2 10*3/uL (ref 1.7–7.7)

## 2013-05-31 MED ORDER — OXYCODONE HCL 10 MG PO TABS: 10.0000 mg | ORAL_TABLET | Freq: Two times a day (BID) | ORAL | Status: DC | PRN

## 2013-05-31 NOTE — Telephone Encounter (Signed)
Rx printed

## 2013-06-01 LAB — T-HELPER CELL (CD4) - (RCID CLINIC ONLY): CD4 T Cell Abs: 500 uL (ref 400–2700)

## 2013-06-01 LAB — HIV-1 RNA QUANT-NO REFLEX-BLD: HIV-1 RNA Quant, Log: <1.3 {Log} (ref <1.30)

## 2013-06-02 ENCOUNTER — Other Ambulatory Visit: Payer: Medicare Other

## 2013-06-14 ENCOUNTER — Other Ambulatory Visit: Payer: Self-pay | Admitting: Licensed Clinical Social Worker

## 2013-06-14 DIAGNOSIS — E785 Hyperlipidemia, unspecified: Secondary | ICD-10-CM

## 2013-06-14 MED ORDER — ATORVASTATIN CALCIUM 20 MG PO TABS: 20.0000 mg | ORAL_TABLET | Freq: Every day | ORAL | Status: AC

## 2013-06-16 ENCOUNTER — Encounter: Payer: Self-pay | Admitting: Infectious Disease

## 2013-06-16 ENCOUNTER — Ambulatory Visit (INDEPENDENT_AMBULATORY_CARE_PROVIDER_SITE_OTHER): Payer: Medicare Other | Admitting: Infectious Disease

## 2013-06-16 VITALS — BP 156/96 | HR 77 | Temp 98.4°F | Wt 192.0 lb

## 2013-06-16 DIAGNOSIS — Z23 Encounter for immunization: Secondary | ICD-10-CM

## 2013-06-16 DIAGNOSIS — E785 Hyperlipidemia, unspecified: Secondary | ICD-10-CM

## 2013-06-16 DIAGNOSIS — B20 Human immunodeficiency virus [HIV] disease: Secondary | ICD-10-CM

## 2013-06-16 DIAGNOSIS — I10 Essential (primary) hypertension: Secondary | ICD-10-CM

## 2013-06-16 DIAGNOSIS — Z113 Encounter for screening for infections with a predominantly sexual mode of transmission: Secondary | ICD-10-CM

## 2013-06-16 NOTE — Progress Notes (Signed)
  Subjective:    Patient ID: Christopher Levine, male    DOB: [birth year], 57 y.o.   MRN: [id number]  HPI  57 y.o. male who is doing superbly well on his antiviral regimen, prezista, norvir and epzicom.   His blood pressure however is still not optimally controlled though though better he claims to be taking  His HCTZ and diltiazem with ACEI.   I tried to refer him to a primary care physician but he does not seem to been seen by one yet.   He claims an [employer] fundraiser in the building wehre he lives check his bp and it is in the 110s.  He rarely smokes cigarettes when stressed by bills etc but not every day   Review of Systems  Constitutional: Negative for fever, chills, diaphoresis, activity change, appetite change, fatigue and unexpected weight change.  HENT: Negative for congestion, sore throat, rhinorrhea, sneezing, trouble swallowing and sinus pressure.   Eyes: Negative for photophobia and visual disturbance.  Respiratory: Negative for cough, chest tightness, shortness of breath, wheezing and stridor.   Cardiovascular: Negative for chest pain, palpitations and leg swelling.  Gastrointestinal: Negative for nausea, vomiting, abdominal pain, diarrhea, constipation, blood in stool, abdominal distention and anal bleeding.  Genitourinary: Negative for dysuria, hematuria, flank pain and difficulty urinating.  Musculoskeletal: Negative for myalgias, back pain, joint swelling, arthralgias and gait problem.  Skin: Negative for color change, pallor, rash and wound.  Neurological: Negative for dizziness, tremors, weakness and light-headedness.  Hematological: Negative for adenopathy. Does not bruise/bleed easily.  Psychiatric/Behavioral: Negative for behavioral problems, confusion, sleep disturbance, dysphoric mood, decreased concentration and agitation.       Objective:   Physical Exam  Constitutional: He is oriented to person, place, and time. He appears well-developed and well-nourished. No distress.   HENT:  Head: Normocephalic and atraumatic.  Mouth/Throat: Oropharynx is clear and moist. No oropharyngeal exudate.  Eyes: Conjunctivae and EOM are normal. Pupils are equal, round, and reactive to light. No scleral icterus.  Neck: Normal range of motion. Neck supple. No JVD present.  Cardiovascular: Normal rate, regular rhythm and normal heart sounds.  Exam reveals no gallop and no friction rub.   No murmur heard. Pulmonary/Chest: Effort normal and breath sounds normal. No respiratory distress. He has no wheezes. He has no rales. He exhibits no tenderness.  Abdominal: He exhibits no distension and no mass. There is no tenderness. There is no rebound and no guarding.  Musculoskeletal: He exhibits no edema and no tenderness.  Lymphadenopathy:    He has no cervical adenopathy.  Neurological: He is alert and oriented to person, place, and time. He has normal reflexes. He exhibits normal muscle tone. Coordination normal.  Skin: Skin is warm and dry. He is not diaphoretic. No erythema. No pallor.  Psychiatric: He has a normal mood and affect. His behavior is normal. Judgment and thought content normal.          Assessment & Plan:  HIV DISEASE  Continue  epzicom with his prezista and norvir   CKD  Trying to optimize this BP,   HYPERLIPIDEMIA  Continue lipitor   HYPERTENSION  Continue diltiazem, refill hctz, add ACEI check microablumin to creatinine ratio  Asthma:   Refilled inhalers, including corticosteroid 1.  Smoking: he told me he would stop completely

## 2013-06-17 LAB — MICROALBUMIN / CREATININE URINE RATIO
Creatinine, Urine: 127.5 mg/dL
Microalb Creat Ratio: 287.1 mg/g — ABNORMAL HIGH (ref 0.0–30.0)

## 2013-06-29 ENCOUNTER — Other Ambulatory Visit: Payer: Self-pay | Admitting: *Deleted

## 2013-06-29 DIAGNOSIS — M1990 Unspecified osteoarthritis, unspecified site: Secondary | ICD-10-CM

## 2013-06-29 DIAGNOSIS — Z113 Encounter for screening for infections with a predominantly sexual mode of transmission: Secondary | ICD-10-CM

## 2013-06-29 DIAGNOSIS — E785 Hyperlipidemia, unspecified: Secondary | ICD-10-CM

## 2013-06-29 DIAGNOSIS — B20 Human immunodeficiency virus [HIV] disease: Secondary | ICD-10-CM

## 2013-06-29 MED ORDER — OXYCODONE HCL 10 MG PO TABS: 10.0000 mg | ORAL_TABLET | Freq: Two times a day (BID) | ORAL | Status: DC | PRN

## 2013-07-30 ENCOUNTER — Other Ambulatory Visit: Payer: Self-pay | Admitting: Licensed Clinical Social Worker

## 2013-07-30 DIAGNOSIS — B20 Human immunodeficiency virus [HIV] disease: Secondary | ICD-10-CM

## 2013-07-30 DIAGNOSIS — E785 Hyperlipidemia, unspecified: Secondary | ICD-10-CM

## 2013-07-30 DIAGNOSIS — Z113 Encounter for screening for infections with a predominantly sexual mode of transmission: Secondary | ICD-10-CM

## 2013-07-30 DIAGNOSIS — M1990 Unspecified osteoarthritis, unspecified site: Secondary | ICD-10-CM

## 2013-07-30 MED ORDER — OXYCODONE HCL 10 MG PO TABS: 10.0000 mg | ORAL_TABLET | Freq: Two times a day (BID) | ORAL | Status: DC | PRN

## 2013-08-30 ENCOUNTER — Other Ambulatory Visit: Payer: Self-pay | Admitting: Licensed Clinical Social Worker

## 2013-08-30 DIAGNOSIS — M1990 Unspecified osteoarthritis, unspecified site: Secondary | ICD-10-CM

## 2013-08-30 DIAGNOSIS — Z113 Encounter for screening for infections with a predominantly sexual mode of transmission: Secondary | ICD-10-CM

## 2013-08-30 DIAGNOSIS — B20 Human immunodeficiency virus [HIV] disease: Secondary | ICD-10-CM

## 2013-08-30 DIAGNOSIS — E785 Hyperlipidemia, unspecified: Secondary | ICD-10-CM

## 2013-08-30 MED ORDER — OXYCODONE HCL 10 MG PO TABS: 10.0000 mg | ORAL_TABLET | Freq: Two times a day (BID) | ORAL | Status: DC | PRN

## 2013-09-07 ENCOUNTER — Other Ambulatory Visit: Payer: Self-pay | Admitting: *Deleted

## 2013-09-07 DIAGNOSIS — B20 Human immunodeficiency virus [HIV] disease: Secondary | ICD-10-CM

## 2013-09-07 MED ORDER — RITONAVIR 100 MG PO CAPS: 100.0000 mg | ORAL_CAPSULE | Freq: Every day | ORAL | Status: DC

## 2013-09-07 MED ORDER — DARUNAVIR ETHANOLATE 800 MG PO TABS: 800.0000 mg | ORAL_TABLET | Freq: Every day | ORAL | Status: DC

## 2013-09-07 MED ORDER — ABACAVIR SULFATE-LAMIVUDINE 600-300 MG PO TABS: 1.0000 | ORAL_TABLET | Freq: Every day | ORAL | Status: DC

## 2013-09-28 ENCOUNTER — Other Ambulatory Visit: Payer: Self-pay | Admitting: *Deleted

## 2013-09-28 DIAGNOSIS — M1990 Unspecified osteoarthritis, unspecified site: Secondary | ICD-10-CM

## 2013-09-28 DIAGNOSIS — E785 Hyperlipidemia, unspecified: Secondary | ICD-10-CM

## 2013-09-28 DIAGNOSIS — Z113 Encounter for screening for infections with a predominantly sexual mode of transmission: Secondary | ICD-10-CM

## 2013-09-28 DIAGNOSIS — B20 Human immunodeficiency virus [HIV] disease: Secondary | ICD-10-CM

## 2013-09-28 MED ORDER — OXYCODONE HCL 10 MG PO TABS: 10.0000 mg | ORAL_TABLET | Freq: Two times a day (BID) | ORAL | Status: DC | PRN

## 2013-10-27 ENCOUNTER — Other Ambulatory Visit: Payer: Self-pay | Admitting: *Deleted

## 2013-10-27 DIAGNOSIS — B20 Human immunodeficiency virus [HIV] disease: Secondary | ICD-10-CM

## 2013-10-27 DIAGNOSIS — E785 Hyperlipidemia, unspecified: Secondary | ICD-10-CM

## 2013-10-27 DIAGNOSIS — M1990 Unspecified osteoarthritis, unspecified site: Secondary | ICD-10-CM

## 2013-10-27 DIAGNOSIS — Z113 Encounter for screening for infections with a predominantly sexual mode of transmission: Secondary | ICD-10-CM

## 2013-10-27 MED ORDER — OXYCODONE HCL 10 MG PO TABS: 10.0000 mg | ORAL_TABLET | Freq: Two times a day (BID) | ORAL | Status: DC | PRN

## 2013-11-18 ENCOUNTER — Telehealth: Payer: Self-pay | Admitting: *Deleted

## 2013-11-18 NOTE — Telephone Encounter (Signed)
Patient called and advised that he is having a problem with his penis. He advised that he has a couple of red bumps on it that are not painful that have been there for couple of days. Offered for the patient to come in for labs and he [place] [facility] and can not get here. The patient advised he thinks that it is from his condom and he is not alarmed. Advised the patient we can not diagnose him over the phone and we be happy to see him if he wants to be seen. He advised he will wait and see if they go away on their own and if not he will call back to be seen.

## 2013-11-29 ENCOUNTER — Other Ambulatory Visit: Payer: Self-pay | Admitting: *Deleted

## 2013-11-29 DIAGNOSIS — M1990 Unspecified osteoarthritis, unspecified site: Secondary | ICD-10-CM

## 2013-11-29 DIAGNOSIS — E785 Hyperlipidemia, unspecified: Secondary | ICD-10-CM

## 2013-11-29 DIAGNOSIS — Z113 Encounter for screening for infections with a predominantly sexual mode of transmission: Secondary | ICD-10-CM

## 2013-11-29 DIAGNOSIS — B20 Human immunodeficiency virus [HIV] disease: Secondary | ICD-10-CM

## 2013-11-29 MED ORDER — OXYCODONE HCL 10 MG PO TABS: 10.0000 mg | ORAL_TABLET | Freq: Two times a day (BID) | ORAL | Status: DC | PRN

## 2013-12-01 ENCOUNTER — Other Ambulatory Visit (HOSPITAL_COMMUNITY)
Admission: RE | Admit: 2013-12-01 | Discharge: 2013-12-01 | Disposition: A | Discharge status: Home / Self Care | Payer: Medicare Other | Source: Ambulatory Visit | Attending: Infectious Disease | Admitting: Infectious Disease

## 2013-12-01 ENCOUNTER — Other Ambulatory Visit (INDEPENDENT_AMBULATORY_CARE_PROVIDER_SITE_OTHER): Payer: Medicare Other

## 2013-12-01 DIAGNOSIS — R21 Rash and other nonspecific skin eruption: Secondary | ICD-10-CM

## 2013-12-01 DIAGNOSIS — Z113 Encounter for screening for infections with a predominantly sexual mode of transmission: Secondary | ICD-10-CM

## 2013-12-01 DIAGNOSIS — N489 Disorder of penis, unspecified: Secondary | ICD-10-CM

## 2013-12-01 DIAGNOSIS — B20 Human immunodeficiency virus [HIV] disease: Secondary | ICD-10-CM

## 2013-12-01 LAB — RPR

## 2013-12-01 NOTE — Addendum Note (Signed)
Addended bySteva Colder: Scotty Weigelt on: 12/01/2013 11:24 AM   Modules accepted: Orders

## 2013-12-04 LAB — WOUND CULTURE
GRAM STAIN: NONE SEEN
Gram Stain: NONE SEEN

## 2013-12-06 LAB — VIRAL CULTURE VIRC: ORGANISM ID, BACTERIA: NEGATIVE

## 2013-12-13 ENCOUNTER — Other Ambulatory Visit: Payer: Medicare Other

## 2013-12-13 DIAGNOSIS — B20 Human immunodeficiency virus [HIV] disease: Secondary | ICD-10-CM

## 2013-12-13 LAB — COMPLETE METABOLIC PANEL WITH GFR
ALT: 18 U/L (ref 0–53)
AST: 16 U/L (ref 0–37)
Albumin: 4.6 g/dL (ref 3.5–5.2)
Alkaline Phosphatase: 52 U/L (ref 39–117)
BUN: 15 mg/dL (ref 6–23)
CO2: 23 mEq/L (ref 19–32)
Calcium: 9 mg/dL (ref 8.4–10.5)
Chloride: 104 mEq/L (ref 96–112)
Creat: 1.49 mg/dL — ABNORMAL HIGH (ref 0.50–1.35)
GFR, Est African American: 60 mL/min
GFR, Est Non African American: 52 mL/min — ABNORMAL LOW
Glucose, Bld: 151 mg/dL — ABNORMAL HIGH (ref 70–99)
POTASSIUM: 4 meq/L (ref 3.5–5.3)
Sodium: 136 mEq/L (ref 135–145)
Total Bilirubin: 0.7 mg/dL (ref 0.2–1.2)
Total Protein: 7.4 g/dL (ref 6.0–8.3)

## 2013-12-13 LAB — CBC WITH DIFFERENTIAL/PLATELET
BASOS PCT: 0 % (ref 0–1)
Basophils Absolute: 0 10*3/uL (ref 0.0–0.1)
EOS ABS: 0.1 10*3/uL (ref 0.0–0.7)
Eosinophils Relative: 1 % (ref 0–5)
HEMATOCRIT: 45.6 % (ref 39.0–52.0)
Hemoglobin: 15.4 g/dL (ref 13.0–17.0)
Lymphocytes Relative: 16 % (ref 12–46)
Lymphs Abs: 1.1 10*3/uL (ref 0.7–4.0)
MCH: 31.4 pg (ref 26.0–34.0)
MCHC: 33.8 g/dL (ref 30.0–36.0)
MCV: 93.1 fL (ref 78.0–100.0)
MONO ABS: 0.6 10*3/uL (ref 0.1–1.0)
MONOS PCT: 9 % (ref 3–12)
NEUTROS PCT: 74 % (ref 43–77)
Neutro Abs: 5 10*3/uL (ref 1.7–7.7)
Platelets: 284 10*3/uL (ref 150–400)
RBC: 4.9 MIL/uL (ref 4.22–5.81)
RDW: 14 % (ref 11.5–15.5)
WBC: 6.8 10*3/uL (ref 4.0–10.5)

## 2013-12-14 LAB — HIV-1 RNA QUANT-NO REFLEX-BLD: HIV-1 RNA Quant, Log: <1.3 {Log} (ref <1.30)

## 2013-12-14 LAB — T-HELPER CELL (CD4) - (RCID CLINIC ONLY)
CD4 T CELL ABS: 420 /uL (ref 400–2700)
CD4 T CELL HELPER: 40 % (ref 33–55)

## 2013-12-23 ENCOUNTER — Telehealth: Payer: Self-pay | Admitting: *Deleted

## 2013-12-23 ENCOUNTER — Other Ambulatory Visit: Payer: Self-pay | Admitting: Infectious Disease

## 2013-12-23 ENCOUNTER — Other Ambulatory Visit: Payer: Self-pay | Admitting: *Deleted

## 2013-12-23 DIAGNOSIS — E785 Hyperlipidemia, unspecified: Secondary | ICD-10-CM

## 2013-12-23 DIAGNOSIS — F1114 Opioid abuse with opioid-induced mood disorder: Secondary | ICD-10-CM

## 2013-12-23 DIAGNOSIS — B20 Human immunodeficiency virus [HIV] disease: Secondary | ICD-10-CM

## 2013-12-23 DIAGNOSIS — M1990 Unspecified osteoarthritis, unspecified site: Secondary | ICD-10-CM

## 2013-12-23 DIAGNOSIS — Z113 Encounter for screening for infections with a predominantly sexual mode of transmission: Secondary | ICD-10-CM

## 2013-12-23 MED ORDER — OXYCODONE HCL 10 MG PO TABS: 10.0000 mg | ORAL_TABLET | Freq: Two times a day (BID) | ORAL | Status: DC | PRN

## 2013-12-23 NOTE — Telephone Encounter (Signed)
I dont recall writing for such a script has he been getting oxycodone from me?

## 2013-12-23 NOTE — Telephone Encounter (Signed)
Patient called requesting a refill on oxycodone for his leg pain. Christopher MolaJacqueline Clorine Swing

## 2013-12-23 NOTE — Telephone Encounter (Signed)
Oh ok, well lets refill for now, he DOES need confirmatoroy opiate screen and urine tox screen both done

## 2013-12-23 NOTE — Telephone Encounter (Signed)
You last filled it 12/01/13 and he has been receiving this from you since 2012.

## 2013-12-27 ENCOUNTER — Ambulatory Visit (INDEPENDENT_AMBULATORY_CARE_PROVIDER_SITE_OTHER): Payer: Medicare Other | Admitting: Infectious Disease

## 2013-12-27 ENCOUNTER — Encounter: Payer: Self-pay | Admitting: Infectious Disease

## 2013-12-27 ENCOUNTER — Other Ambulatory Visit: Payer: Self-pay | Admitting: Infectious Disease

## 2013-12-27 VITALS — BP 186/128 | HR 81 | Temp 98.2°F | Wt 197.0 lb

## 2013-12-27 DIAGNOSIS — Z79899 Other long term (current) drug therapy: Secondary | ICD-10-CM

## 2013-12-27 DIAGNOSIS — M25569 Pain in unspecified knee: Secondary | ICD-10-CM

## 2013-12-27 DIAGNOSIS — Z113 Encounter for screening for infections with a predominantly sexual mode of transmission: Secondary | ICD-10-CM

## 2013-12-27 DIAGNOSIS — M25559 Pain in unspecified hip: Secondary | ICD-10-CM

## 2013-12-27 DIAGNOSIS — I10 Essential (primary) hypertension: Secondary | ICD-10-CM

## 2013-12-27 DIAGNOSIS — M1990 Unspecified osteoarthritis, unspecified site: Secondary | ICD-10-CM

## 2013-12-27 DIAGNOSIS — J45909 Unspecified asthma, uncomplicated: Secondary | ICD-10-CM

## 2013-12-27 DIAGNOSIS — E785 Hyperlipidemia, unspecified: Secondary | ICD-10-CM

## 2013-12-27 DIAGNOSIS — B20 Human immunodeficiency virus [HIV] disease: Secondary | ICD-10-CM

## 2013-12-27 MED ORDER — OXYCODONE HCL 10 MG PO TABS: 10.0000 mg | ORAL_TABLET | Freq: Two times a day (BID) | ORAL | Status: DC | PRN

## 2013-12-27 MED ORDER — DILTIAZEM HCL ER 240 MG PO CP24: 240.0000 mg | ORAL_CAPSULE | Freq: Every day | ORAL | Status: DC

## 2013-12-27 MED ORDER — ALBUTEROL SULFATE HFA 108 (90 BASE) MCG/ACT IN AERS: 1.0000 | INHALATION_SPRAY | Freq: Four times a day (QID) | RESPIRATORY_TRACT | Status: DC | PRN

## 2013-12-27 NOTE — Patient Instructions (Signed)
2 MONTH FOLLOWUP FOR BLOOD PRESSURE WITH DR. VAN DAM  6 MONTH FOR 042 LABS AND VISIT

## 2013-12-27 NOTE — Progress Notes (Signed)
Subjective:    Patient ID: Christopher Levine, male    DOB: [birth year], 58 y.o.   MRN: [id number]  HPI   58 y.o. male who is doing superbly well on his antiviral regimen, prezista, norvir and epzicom.   His blood pressure however is still not optimally controlled though though better he claims to be taking  His HCTZ and  ACEI but he did not take his diltiazem  He needs to be seen by PCP   He rarely smokes cigarettes when stressed by bills etc but not every day   Review of Systems  Constitutional: Negative for fever, chills, diaphoresis, activity change, appetite change, fatigue and unexpected weight change.  HENT: Negative for congestion, rhinorrhea, sinus pressure, sneezing, sore throat and trouble swallowing.   Eyes: Negative for photophobia and visual disturbance.  Respiratory: Negative for cough, chest tightness, shortness of breath, wheezing and stridor.   Cardiovascular: Negative for chest pain, palpitations and leg swelling.  Gastrointestinal: Negative for nausea, vomiting, abdominal pain, diarrhea, constipation, blood in stool, abdominal distention and anal bleeding.  Genitourinary: Negative for dysuria, hematuria, flank pain and difficulty urinating.  Musculoskeletal: Negative for arthralgias, back pain, gait problem, joint swelling and myalgias.  Skin: Negative for color change, pallor, rash and wound.  Neurological: Negative for dizziness, tremors, weakness and light-headedness.  Hematological: Negative for adenopathy. Does not bruise/bleed easily.  Psychiatric/Behavioral: Negative for behavioral problems, confusion, sleep disturbance, dysphoric mood, decreased concentration and agitation.       Objective:   Physical Exam  Constitutional: He is oriented to person, place, and time. He appears well-developed and well-nourished. No distress.  HENT:  Head: Normocephalic and atraumatic.  Mouth/Throat: Oropharynx is clear and moist. No oropharyngeal exudate.  Eyes: Conjunctivae  and EOM are normal. Pupils are equal, round, and reactive to light. No scleral icterus.  Neck: Normal range of motion. Neck supple. No JVD present.  Cardiovascular: Normal rate, regular rhythm and normal heart sounds.  Exam reveals no gallop and no friction rub.   No murmur heard. Pulmonary/Chest: Effort normal and breath sounds normal. No respiratory distress. He has no wheezes. He has no rales. He exhibits no tenderness.  Abdominal: He exhibits no distension and no mass. There is no tenderness. There is no rebound and no guarding.  Musculoskeletal: He exhibits no edema and no tenderness.  Lymphadenopathy:    He has no cervical adenopathy.  Neurological: He is alert and oriented to person, place, and time. He has normal reflexes. He exhibits normal muscle tone. Coordination normal.  Skin: Skin is warm and dry. He is not diaphoretic. No erythema. No pallor.  Psychiatric: He has a normal mood and affect. His behavior is normal. Judgment and thought content normal.          Assessment & Plan:  HIV DISEASE  Continue  epzicom with his prezista and norvir  I spent greater than 25 minutes with the patient including greater than 50% of time in face to face counsel of the patient and in coordination of their care.  CKD  Trying to optimize this BP, he needs to take all of his meds regularly   HYPERLIPIDEMIA  Continue lipitor   HYPERTENSION  Continue diltiazem, refill hctz, add ACEI check bring back for bp check in 2 months  Smoking: he told me he would stop completely   Left knee pain: claims he needs knee replacment: check urine confirmatory screen re his opiates and urine tox screen. He says he has not had meds for 4 days (  he tried to refill early, last week)

## 2013-12-28 LAB — DRUG SCREEN, URINE
AMPHETAMINE SCRN UR: NEGATIVE
BENZODIAZEPINES.: NEGATIVE
Barbiturate Quant, Ur: NEGATIVE
COCAINE METABOLITES: NEGATIVE
Creatinine,U: 160.52 mg/dL
MARIJUANA METABOLITE: NEGATIVE
Methadone: NEGATIVE
Opiates: NEGATIVE
PHENCYCLIDINE (PCP): NEGATIVE
PROPOXYPHENE: NEGATIVE

## 2013-12-29 ENCOUNTER — Other Ambulatory Visit: Payer: Self-pay | Admitting: Infectious Disease

## 2013-12-29 LAB — OPIATES/OPIOIDS (LC/MS-MS)
Codeine Urine: NEGATIVE ng/mL
HYDROCODONE: NEGATIVE ng/mL
HYDROMORPHONE: NEGATIVE ng/mL
Heroin (6-AM), UR: NEGATIVE ng/mL
Morphine Urine: NEGATIVE ng/mL
Norhydrocodone, Ur: NEGATIVE ng/mL
Noroxycodone, Ur: NEGATIVE ng/mL
OXYMORPHONE, URINE: NEGATIVE ng/mL
Oxycodone, ur: NEGATIVE ng/mL

## 2014-01-26 ENCOUNTER — Other Ambulatory Visit: Payer: Self-pay | Admitting: *Deleted

## 2014-01-26 ENCOUNTER — Encounter: Payer: Self-pay | Admitting: Infectious Disease

## 2014-01-26 ENCOUNTER — Telehealth: Payer: Self-pay | Admitting: *Deleted

## 2014-01-26 DIAGNOSIS — E785 Hyperlipidemia, unspecified: Secondary | ICD-10-CM

## 2014-01-26 DIAGNOSIS — M1990 Unspecified osteoarthritis, unspecified site: Secondary | ICD-10-CM

## 2014-01-26 DIAGNOSIS — B20 Human immunodeficiency virus [HIV] disease: Secondary | ICD-10-CM

## 2014-01-26 DIAGNOSIS — Z113 Encounter for screening for infections with a predominantly sexual mode of transmission: Secondary | ICD-10-CM

## 2014-01-26 MED ORDER — OXYCODONE HCL 10 MG PO TABS: 10.0000 mg | ORAL_TABLET | Freq: Two times a day (BID) | ORAL | Status: DC | PRN

## 2014-01-26 NOTE — Telephone Encounter (Signed)
Left message with patient that Dr Daiva EvesVan Dam will be giving patient one last refill of his oxycodone 10mg  tablets due to his recent lab work. Dr Daiva EvesVan Dam will give him Oxycodone 10 mg #30 without refills.  Per Dr Daiva EvesVan Dam, this is the last refill from [facility].  Patient is welcome to schedule an appointment to discuss this with Dr. Daiva EvesVan Dam. Andree CossMichelle M Aniella Wandrey, RN

## 2014-01-27 ENCOUNTER — Telehealth: Payer: Self-pay | Admitting: *Deleted

## 2014-01-27 NOTE — Telephone Encounter (Signed)
Pt has called x2 today asking whether his pain rx has been signed.  Pt has been told that Dr. Daiva EvesVan Dam is not in the office today and will not return until Tuesday, April 14th.  Pt stated that he has "needed to take up to 4 tablets / day due to the pain he is having.  RN asked the pt whether he has been seen by an orthopedic MD.  Pt stated that he has an appt tomorrow.  Pt advised that he will be called when Dr. Daiva EvesVan Dam has signed the rx.

## 2014-02-01 ENCOUNTER — Other Ambulatory Visit: Payer: Self-pay | Admitting: *Deleted

## 2014-02-01 ENCOUNTER — Other Ambulatory Visit: Payer: Self-pay

## 2014-02-01 NOTE — Telephone Encounter (Signed)
No problem. I signed every narcotic script in my box. He violated his pain contract with zero opiates in urine screen confirmatory so he has no place complaining. I am writing for a month of meds which is more than some would do. I will NOT be in town again till Friday. If for some reason I have not signed his narcotic script can you ask ID clinic MD to sign the one script. This is last one for him

## 2014-02-02 NOTE — Telephone Encounter (Signed)
Christopher Levine came by yesterday.  I explained to him about not receiving anymore pain rxes from this Center and why the quantity was limited to 30 tablets.  He verbalized understanding.

## 2014-02-04 NOTE — Telephone Encounter (Signed)
Very good thanks Angelique BlonderDenise!

## 2014-04-04 ENCOUNTER — Other Ambulatory Visit: Payer: Self-pay | Admitting: *Deleted

## 2014-04-04 DIAGNOSIS — I10 Essential (primary) hypertension: Secondary | ICD-10-CM

## 2014-04-04 MED ORDER — LISINOPRIL 10 MG PO TABS: 10.0000 mg | ORAL_TABLET | Freq: Every day | ORAL | Status: DC

## 2014-06-29 ENCOUNTER — Other Ambulatory Visit: Payer: Medicare Other

## 2014-07-04 ENCOUNTER — Ambulatory Visit: Payer: Medicare Other | Admitting: Infectious Disease

## 2014-07-13 ENCOUNTER — Ambulatory Visit: Payer: Medicare Other | Admitting: Infectious Disease

## 2014-07-21 ENCOUNTER — Other Ambulatory Visit: Payer: Medicare Other

## 2014-08-22 ENCOUNTER — Other Ambulatory Visit: Payer: Medicare Other

## 2014-09-05 ENCOUNTER — Ambulatory Visit: Payer: Medicare Other | Admitting: Infectious Disease

## 2014-11-09 ENCOUNTER — Other Ambulatory Visit: Payer: Self-pay | Admitting: Infectious Disease

## 2014-11-09 ENCOUNTER — Ambulatory Visit
Admission: RE | Admit: 2014-11-09 | Discharge: 2014-11-09 | Disposition: A | Discharge status: Home / Self Care | Payer: Medicare Other | Source: Ambulatory Visit | Attending: Infectious Disease | Admitting: Infectious Disease

## 2014-11-09 ENCOUNTER — Ambulatory Visit (INDEPENDENT_AMBULATORY_CARE_PROVIDER_SITE_OTHER): Payer: Medicare Other | Admitting: Infectious Disease

## 2014-11-09 ENCOUNTER — Encounter: Payer: Self-pay | Admitting: Infectious Disease

## 2014-11-09 ENCOUNTER — Other Ambulatory Visit (HOSPITAL_COMMUNITY)
Admission: RE | Admit: 2014-11-09 | Discharge: 2014-11-09 | Disposition: A | Discharge status: Home / Self Care | Payer: Medicare Other | Source: Ambulatory Visit | Attending: Infectious Disease | Admitting: Infectious Disease

## 2014-11-09 VITALS — BP 166/114 | HR 88 | Temp 98.4°F | Wt 197.0 lb

## 2014-11-09 DIAGNOSIS — B20 Human immunodeficiency virus [HIV] disease: Secondary | ICD-10-CM

## 2014-11-09 DIAGNOSIS — I10 Essential (primary) hypertension: Secondary | ICD-10-CM

## 2014-11-09 DIAGNOSIS — Z113 Encounter for screening for infections with a predominantly sexual mode of transmission: Secondary | ICD-10-CM

## 2014-11-09 DIAGNOSIS — E785 Hyperlipidemia, unspecified: Secondary | ICD-10-CM

## 2014-11-09 DIAGNOSIS — Z23 Encounter for immunization: Secondary | ICD-10-CM

## 2014-11-09 LAB — COMPLETE METABOLIC PANEL WITH GFR
ALK PHOS: 71 U/L (ref 39–117)
ALT: 45 U/L (ref 0–53)
AST: 39 U/L — ABNORMAL HIGH (ref 0–37)
Albumin: 4.5 g/dL (ref 3.5–5.2)
BUN: 17 mg/dL (ref 6–23)
CHLORIDE: 105 meq/L (ref 96–112)
CO2: 25 mEq/L (ref 19–32)
Calcium: 9.4 mg/dL (ref 8.4–10.5)
Creat: 1.5 mg/dL — ABNORMAL HIGH (ref 0.50–1.35)
GFR, EST AFRICAN AMERICAN: 59 mL/min — AB
GFR, EST NON AFRICAN AMERICAN: 51 mL/min — AB
Glucose, Bld: 96 mg/dL (ref 70–99)
Potassium: 4.9 mEq/L (ref 3.5–5.3)
SODIUM: 138 meq/L (ref 135–145)
TOTAL PROTEIN: 7.8 g/dL (ref 6.0–8.3)
Total Bilirubin: 0.3 mg/dL (ref 0.2–1.2)

## 2014-11-09 LAB — CBC WITH DIFFERENTIAL/PLATELET
BASOS ABS: 0 10*3/uL (ref 0.0–0.1)
Basophils Relative: 0 % (ref 0–1)
EOS ABS: 0 10*3/uL (ref 0.0–0.7)
Eosinophils Relative: 1 % (ref 0–5)
HCT: 45.6 % (ref 39.0–52.0)
Hemoglobin: 15.1 g/dL (ref 13.0–17.0)
LYMPHS ABS: 1.2 10*3/uL (ref 0.7–4.0)
Lymphocytes Relative: 26 % (ref 12–46)
MCH: 30.5 pg (ref 26.0–34.0)
MCHC: 33.1 g/dL (ref 30.0–36.0)
MCV: 92.1 fL (ref 78.0–100.0)
MPV: 8.9 fL (ref 8.6–12.4)
Monocytes Absolute: 0.6 10*3/uL (ref 0.1–1.0)
Monocytes Relative: 12 % (ref 3–12)
Neutro Abs: 2.9 10*3/uL (ref 1.7–7.7)
Neutrophils Relative %: 61 % (ref 43–77)
PLATELETS: 222 10*3/uL (ref 150–400)
RBC: 4.95 MIL/uL (ref 4.22–5.81)
RDW: 13.6 % (ref 11.5–15.5)
WBC: 4.7 10*3/uL (ref 4.0–10.5)

## 2014-11-09 MED ORDER — ABACAVIR-DOLUTEGRAVIR-LAMIVUD 600-50-300 MG PO TABS: 1.0000 | ORAL_TABLET | Freq: Every day | ORAL | Status: DC

## 2014-11-09 NOTE — Patient Instructions (Signed)
YOur new medicine is TRIUMEQ this is a COMPLETE regimen for your HIV  Take this EVERY SINGLE DAY with or without food  Do NOT take it at the same time as vitamins or nutrional supplements that contain large AMOUNTS Of CALCIUM OR MAGNESIUM UNLESS YOU ARE ALSO GOING TO BE EATING A FULL MEAL WITH THE TRIUMEQ  [place] you have been on the new ALL in ONE PILL  Please make appt in 6 weeks  We will get plain films of yoru knee and I would recommend we have you see Dr Turner Danielsowan again

## 2014-11-09 NOTE — Progress Notes (Signed)
  Subjective:    Patient ID: Christopher Levine, male    DOB: 1956/08/19, 59 y.o.   MRN: [id number]  HPI   59  y.o. male who HAD BEEN superbly well on his antiviral regimen, prezista, norvir and epzicom, but who had stopped taking his meds in the past 2 months because of going through a lot of "things" and suffering from OA in mx joints, in particular his right knee. He had severe OA of the left hip and had THA by Dr Turner Danielsowan.  [facility] to reduce his pill burden. I am skeptical that he is taking his anti-HTNsives either  I will get plain films of right knee.   Review of Systems  Constitutional: Negative for fever, chills, diaphoresis, activity change, appetite change, fatigue and unexpected weight change.  HENT: Negative for congestion, rhinorrhea, sinus pressure, sneezing, sore throat and trouble swallowing.   Eyes: Negative for photophobia and visual disturbance.  Respiratory: Negative for cough, chest tightness, shortness of breath, wheezing and stridor.   Cardiovascular: Negative for chest pain, palpitations and leg swelling.  Gastrointestinal: Negative for nausea, vomiting, abdominal pain, diarrhea, constipation, blood in stool, abdominal distention and anal bleeding.  Genitourinary: Negative for dysuria, hematuria, flank pain and difficulty urinating.  Musculoskeletal: Positive for myalgias, joint swelling and gait problem. Negative for back pain and arthralgias.  Skin: Negative for color change, pallor, rash and wound.  Neurological: Negative for dizziness, tremors, weakness and light-headedness.  Hematological: Negative for adenopathy. Does not bruise/bleed easily.  Psychiatric/Behavioral: Negative for behavioral problems, confusion, sleep disturbance, dysphoric mood, decreased concentration and agitation.       Objective:   Physical Exam  Constitutional: He is oriented to person, place, and time. He appears well-developed and well-nourished. No distress.   HENT:  Head: Normocephalic and atraumatic.  Mouth/Throat: Oropharynx is clear and moist. No oropharyngeal exudate.  Eyes: Conjunctivae and EOM are normal. Pupils are equal, round, and reactive to light. No scleral icterus.  Neck: Normal range of motion. Neck supple. No JVD present.  Cardiovascular: Normal rate, regular rhythm and normal heart sounds.   Pulmonary/Chest: Effort normal and breath sounds normal. No respiratory distress.  Abdominal: Soft. He exhibits no distension.  Musculoskeletal:       Right knee: He exhibits decreased range of motion. Tenderness found.  Lymphadenopathy:    He has no cervical adenopathy.  Neurological: He is alert and oriented to person, place, and time. He exhibits normal muscle tone. Coordination normal.  Skin: Skin is warm and dry. He is not diaphoretic. No erythema. No pallor.  Psychiatric: He has a normal mood and affect. His behavior is normal. Judgment and thought content normal.          Assessment & Plan:  HIV DISEASE  [facility] today and in one month  CKD  BP out of control today, will re-emphasize need for BP   HYPERLIPIDEMIA  Needs to start restart this  HYPERTENSION  Needs to be back on meds  Knee pain: will get plain films and will refer to Dr. Turner Danielsowan   Left knee pain: claims he needs knee replacment: check urine confirmatory screen re his opiates and urine tox screen. He says he has not had meds for 4 days (he tried to refill early, last week)

## 2014-11-10 ENCOUNTER — Telehealth: Payer: Self-pay | Admitting: Licensed Clinical Social Worker

## 2014-11-10 LAB — MICROALBUMIN / CREATININE URINE RATIO
Creatinine, Urine: 44.5 mg/dL
MICROALB UR: 41.6 mg/dL — AB (ref <2.0)
MICROALB/CREAT RATIO: 934.8 mg/g — AB (ref 0.0–30.0)

## 2014-11-10 LAB — RPR

## 2014-11-10 LAB — T-HELPER CELL (CD4) - (RCID CLINIC ONLY)
CD4 % Helper T Cell: 31 % — ABNORMAL LOW (ref 33–55)
CD4 T Cell Abs: 360 /uL — ABNORMAL LOW (ref 400–2700)

## 2014-11-10 LAB — URINE CYTOLOGY ANCILLARY ONLY
CHLAMYDIA, DNA PROBE: NEGATIVE
NEISSERIA GONORRHEA: NEGATIVE

## 2014-11-10 NOTE — Telephone Encounter (Signed)
Patient owes a bill at Dr. Wadie Lessenowan's office, he will need to make arrangements before getting an appointment

## 2014-11-10 NOTE — Telephone Encounter (Signed)
-----   Message from Randall Hissornelius N Van Dam, MD sent at 11/09/2014  4:34 PM EST ----- Lets have Mr Lanae BoastGarner see Dr Turner Danielsowan again. He may benefit from knee aspiration for cell count and differential, crystals, cutlure and possibly injection and may need plans for surgery

## 2014-11-10 NOTE — Telephone Encounter (Signed)
That doesn't surprise me but he needs to get help

## 2014-11-14 LAB — HIV-1 RNA ULTRAQUANT REFLEX TO GENTYP+
HIV 1 RNA Quant: 13274 copies/mL — ABNORMAL HIGH (ref <20)
HIV-1 RNA Quant, Log: 4.12 {Log} — ABNORMAL HIGH (ref <1.30)

## 2014-11-22 LAB — HIV-1 GENOTYPR PLUS

## 2014-12-07 ENCOUNTER — Other Ambulatory Visit: Payer: Medicare Other

## 2014-12-07 DIAGNOSIS — B20 Human immunodeficiency virus [HIV] disease: Secondary | ICD-10-CM

## 2014-12-07 LAB — CBC WITH DIFFERENTIAL/PLATELET
Basophils Absolute: 0 10*3/uL (ref 0.0–0.1)
Basophils Relative: 0 % (ref 0–1)
EOS ABS: 0 10*3/uL (ref 0.0–0.7)
EOS PCT: 1 % (ref 0–5)
HCT: 44.5 % (ref 39.0–52.0)
Hemoglobin: 15.2 g/dL (ref 13.0–17.0)
Lymphocytes Relative: 28 % (ref 12–46)
Lymphs Abs: 1.3 10*3/uL (ref 0.7–4.0)
MCH: 31.4 pg (ref 26.0–34.0)
MCHC: 34.2 g/dL (ref 30.0–36.0)
MCV: 91.9 fL (ref 78.0–100.0)
MPV: 8.8 fL (ref 8.6–12.4)
Monocytes Absolute: 0.7 10*3/uL (ref 0.1–1.0)
Monocytes Relative: 14 % — ABNORMAL HIGH (ref 3–12)
NEUTROS ABS: 2.7 10*3/uL (ref 1.7–7.7)
Neutrophils Relative %: 57 % (ref 43–77)
Platelets: 192 10*3/uL (ref 150–400)
RBC: 4.84 MIL/uL (ref 4.22–5.81)
RDW: 13.6 % (ref 11.5–15.5)
WBC: 4.8 10*3/uL (ref 4.0–10.5)

## 2014-12-08 LAB — HIV-1 RNA QUANT-NO REFLEX-BLD
HIV 1 RNA QUANT: 30 {copies}/mL — AB (ref <20)
HIV-1 RNA QUANT, LOG: 1.48 {Log} — AB (ref <1.30)

## 2014-12-08 LAB — T-HELPER CELL (CD4) - (RCID CLINIC ONLY)
CD4 T CELL HELPER: 40 % (ref 33–55)
CD4 T Cell Abs: 590 /uL (ref 400–2700)

## 2014-12-21 ENCOUNTER — Ambulatory Visit: Payer: Medicare Other | Admitting: Infectious Disease

## 2015-01-07 ENCOUNTER — Other Ambulatory Visit: Payer: Self-pay | Admitting: Infectious Disease

## 2015-01-07 DIAGNOSIS — I10 Essential (primary) hypertension: Secondary | ICD-10-CM

## 2015-01-11 ENCOUNTER — Encounter: Payer: Self-pay | Admitting: Infectious Disease

## 2015-01-11 ENCOUNTER — Ambulatory Visit (INDEPENDENT_AMBULATORY_CARE_PROVIDER_SITE_OTHER): Payer: Medicare Other | Admitting: Infectious Disease

## 2015-01-11 VITALS — BP 146/87 | HR 73 | Temp 98.1°F | Wt 195.0 lb

## 2015-01-11 DIAGNOSIS — E785 Hyperlipidemia, unspecified: Secondary | ICD-10-CM | POA: Diagnosis not present

## 2015-01-11 DIAGNOSIS — N183 Chronic kidney disease, stage 3 (moderate): Secondary | ICD-10-CM | POA: Diagnosis not present

## 2015-01-11 DIAGNOSIS — B20 Human immunodeficiency virus [HIV] disease: Secondary | ICD-10-CM

## 2015-01-11 DIAGNOSIS — F331 Major depressive disorder, recurrent, moderate: Secondary | ICD-10-CM

## 2015-01-11 DIAGNOSIS — I10 Essential (primary) hypertension: Secondary | ICD-10-CM

## 2015-01-11 MED ORDER — LISINOPRIL 20 MG PO TABS: 20.0000 mg | ORAL_TABLET | Freq: Every day | ORAL | Status: AC

## 2015-01-11 NOTE — Progress Notes (Signed)
Subjective:    Patient ID: Christopher Levine, male    DOB: 26-Dec-1955, 59 y.o.   MRN: [id number]  HPI   59  y.o. 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 He had severe OA of the left hip and had THA by Dr Turner Daniels.  We discussed changing him to [facility] to reduce his pill burden which we have done and now he has nice virological suppression.   Lab Results  Component Value Date   HIV1RNAQUANT 30* 12/07/2014   Lab Results  Component Value Date   CD4TABS 590 12/07/2014   CD4TABS 360* 11/09/2014   CD4TABS 420 12/13/2013   His BP is much better controlled today but he does not seem certain of his medicines and in fact my CMA believes that this patient cannot read to identify pills.  He endorses taking his TRIUMEQ religiously and two BP meds.  He is still under great deal of stress "[place]" and constantly having favors asked of him. He is moving to a new residence which he says will help solve a lot of his stress.  Review of Systems  Constitutional: Negative for fever, chills, diaphoresis, activity change, appetite change, fatigue and unexpected weight change.  HENT: Negative for congestion, rhinorrhea, sinus pressure, sneezing, sore throat and trouble swallowing.   Eyes: Negative for photophobia and visual disturbance.  Respiratory: Negative for cough, chest tightness, shortness of breath, wheezing and stridor.   Cardiovascular: Negative for chest pain, palpitations and leg swelling.  Gastrointestinal: Negative for nausea, vomiting, abdominal pain, diarrhea, constipation, blood in stool, abdominal distention and anal bleeding.  Genitourinary: Negative for dysuria, hematuria, flank pain and difficulty urinating.  Musculoskeletal: Positive for myalgias. Negative for back pain, joint swelling,  arthralgias and gait problem.  Skin: Negative for color change, pallor, rash and wound.  Neurological: Negative for dizziness, tremors, weakness and light-headedness.  Hematological: Negative for adenopathy. Does not bruise/bleed easily.  Psychiatric/Behavioral: Positive for dysphoric mood. Negative for behavioral problems, confusion, sleep disturbance, decreased concentration and agitation. The patient is nervous/anxious.        Objective:   Physical Exam  Constitutional: He is oriented to person, place, and time. He appears well-developed and well-nourished. No distress.  HENT:  Head: Normocephalic and atraumatic.  Mouth/Throat: Oropharynx is clear and moist. No oropharyngeal exudate.  Eyes: Conjunctivae and EOM are normal. Pupils are equal, round, and reactive to light. No scleral icterus.  Neck: Normal range of motion. Neck supple. No JVD present.  Cardiovascular: Normal rate, regular rhythm and normal heart sounds.   Pulmonary/Chest: Effort normal and breath sounds normal. No respiratory distress.  Abdominal: Soft. He exhibits no distension.  Lymphadenopathy:    He has no cervical adenopathy.  Neurological: He is alert and oriented to person, place, and time. He exhibits normal muscle tone. Coordination normal.  Skin: Skin is warm and dry. He is not diaphoretic. No erythema. No pallor.  Psychiatric: His behavior is normal. Judgment and thought content normal. He exhibits a depressed mood.          Assessment & Plan:  HIV DISEASE  Continue  TRIUMEQ  And reinforced need for high compliance  CKD  BP under better control today I am upping his ACEI to  but I would also like him to FIRST after collecting this medicine come  to clinic with ALL of his anti-HTNSIves so that he can meet with Ulyses SouthwardMinh Pham who can provide him with a pill box, thoroughly go over his meds and provide him with visual pill table  I spent greater than 25 minutes with the patient including greater than 50%  of time in face to face counsel of the patient and in coordination of their care.   HYPERLIPIDEMIA  He told me he was taking but told my CMA no  HYPERTENSION  See above discussion  Depression, anxiety: Recurrent episode with situational component. He declined counselling.

## 2015-01-24 ENCOUNTER — Ambulatory Visit: Payer: Medicare Other

## 2015-01-31 ENCOUNTER — Ambulatory Visit: Payer: Medicare Other

## 2015-03-12 ENCOUNTER — Other Ambulatory Visit: Payer: Self-pay | Admitting: Infectious Disease

## 2015-04-12 ENCOUNTER — Telehealth: Payer: Self-pay | Admitting: *Deleted

## 2015-04-12 NOTE — Telephone Encounter (Signed)
Received a signed release from a housing agency in [place] for office note and labs on patient to assist with placement. Last office note and labs faxed to [phone] phone [phone] Ext 200 [facility] [employer] Leonette Most and Juliette Alcide Juanell Fairly) notified. Wendall Mola

## 2015-05-01 ENCOUNTER — Other Ambulatory Visit: Payer: Medicare Other

## 2015-05-04 ENCOUNTER — Other Ambulatory Visit: Payer: Self-pay | Admitting: Infectious Disease

## 2015-05-04 DIAGNOSIS — B20 Human immunodeficiency virus [HIV] disease: Secondary | ICD-10-CM

## 2015-05-04 NOTE — Telephone Encounter (Signed)
Pt now living in [place].  Has been living in [place] since at least 04/12/15.  Pharmacy will give the pt a message that this is his last refill from [facility] and to establish HIV care as soon as possible in [place].

## 2015-05-04 NOTE — Telephone Encounter (Signed)
Christopher BlonderDenise I would prefer giving him 3 month supplly to give him time

## 2015-05-09 MED ORDER — ABACAVIR-DOLUTEGRAVIR-LAMIVUD 600-50-300 MG PO TABS: 1.0000 | ORAL_TABLET | Freq: Every day | ORAL | Status: AC

## 2015-05-09 NOTE — Addendum Note (Signed)
Addended by: Jennet MaduroESTRIDGE, Jaxzen Vanhorn D on: 05/09/2015 05:16 PM   Modules accepted: Orders

## 2015-05-09 NOTE — Telephone Encounter (Signed)
Done

## 2015-05-15 ENCOUNTER — Ambulatory Visit: Payer: Medicare Other | Admitting: Infectious Disease
